# Patient Record
Sex: Male | Born: 1945 | Race: Black or African American | Hispanic: No | Marital: Married | Smoking: Former smoker
Health system: Southern US, Community
[De-identification: ages and names within clinical notes are randomized; demographics above are authoritative.]

## PROBLEM LIST (undated history)

## (undated) DIAGNOSIS — I10 Essential (primary) hypertension: Secondary | ICD-10-CM

## (undated) DIAGNOSIS — I2510 Atherosclerotic heart disease of native coronary artery without angina pectoris: Secondary | ICD-10-CM

## (undated) DIAGNOSIS — E785 Hyperlipidemia, unspecified: Secondary | ICD-10-CM

## (undated) HISTORY — PX: CARDIAC CATHETERIZATION: SHX172

## (undated) HISTORY — PX: ROTATOR CUFF REPAIR: SHX139

## (undated) HISTORY — PX: MOUTH SURGERY: SHX715

---

## 1999-08-27 ENCOUNTER — Ambulatory Visit (HOSPITAL_COMMUNITY): Admission: RE | Admit: 1999-08-27 | Discharge: 1999-08-28 | Payer: Self-pay | Admitting: *Deleted

## 1999-08-27 ENCOUNTER — Encounter: Payer: Self-pay | Admitting: *Deleted

## 1999-09-01 ENCOUNTER — Encounter: Admission: RE | Admit: 1999-09-01 | Discharge: 1999-11-30 | Payer: Self-pay | Admitting: Pulmonary Disease

## 2004-09-29 ENCOUNTER — Ambulatory Visit: Payer: Self-pay | Admitting: Cardiology

## 2004-10-29 ENCOUNTER — Ambulatory Visit: Payer: Self-pay

## 2004-11-23 ENCOUNTER — Ambulatory Visit (HOSPITAL_COMMUNITY): Admission: RE | Admit: 2004-11-23 | Discharge: 2004-11-23 | Payer: Self-pay | Admitting: Pulmonary Disease

## 2005-11-08 HISTORY — PX: CARDIAC CATHETERIZATION: SHX172

## 2005-11-24 ENCOUNTER — Ambulatory Visit: Payer: Self-pay | Admitting: Cardiology

## 2006-05-19 ENCOUNTER — Ambulatory Visit (HOSPITAL_COMMUNITY): Admission: RE | Admit: 2006-05-19 | Discharge: 2006-05-19 | Payer: Self-pay | Admitting: Pulmonary Disease

## 2007-01-27 ENCOUNTER — Ambulatory Visit: Payer: Self-pay | Admitting: Internal Medicine

## 2007-01-27 ENCOUNTER — Ambulatory Visit (HOSPITAL_COMMUNITY): Admission: RE | Admit: 2007-01-27 | Discharge: 2007-01-27 | Payer: Self-pay | Admitting: Internal Medicine

## 2008-04-02 ENCOUNTER — Ambulatory Visit: Payer: Self-pay | Admitting: Cardiology

## 2008-04-08 ENCOUNTER — Ambulatory Visit: Payer: Self-pay

## 2009-05-24 DIAGNOSIS — I2510 Atherosclerotic heart disease of native coronary artery without angina pectoris: Secondary | ICD-10-CM | POA: Insufficient documentation

## 2009-05-24 DIAGNOSIS — E119 Type 2 diabetes mellitus without complications: Secondary | ICD-10-CM | POA: Insufficient documentation

## 2009-05-24 DIAGNOSIS — R0609 Other forms of dyspnea: Secondary | ICD-10-CM

## 2009-05-24 DIAGNOSIS — R0989 Other specified symptoms and signs involving the circulatory and respiratory systems: Secondary | ICD-10-CM | POA: Insufficient documentation

## 2009-06-05 ENCOUNTER — Ambulatory Visit: Payer: Self-pay | Admitting: Cardiology

## 2011-02-12 ENCOUNTER — Other Ambulatory Visit: Payer: Self-pay | Admitting: Cardiology

## 2011-03-23 NOTE — Assessment & Plan Note (Signed)
[facility]                            CARDIOLOGY OFFICE NOTE   NAME:Timothy Brennan, Timothy Brennan                       MRN:          [id number]  DATE:04/02/2008                            DOB:          07-23-1946    Mr. Gunia returns today for further management of his coronary artery  disease.  It has been over 2 years since we saw him last.   He has had a previous stent to the LAD in October of 2000.  His last  stress Myoview was October 29, 2004, which showed EF 49% with some mild  superimposed ischemia and scarring of the apex.  We have treated him  medically.   He is having no angina.  He continues to work and has for 13 straight  years at [employer].  He commutes from [place] every day.   He is having his blood work followed by Dr. Juanetta Gosling.  He has type 2  diabetes and mixed hyperlipidemia.   He currently denies any orthopnea, PND or peripheral edema.  He has had  no palpitations, syncope or presyncope.  He has had no angina.   He does have some dyspnea on exertion.   CURRENT MEDS:  1. Accupril 20 mg a day.  2. Zocor 10 mg a day.  3. Aspirin 325 mg a day.  4. Foltx daily.  5. Norvasc 10 mg a day.  6. Fish oil 1000 mg a day.  7. Hydrochlorothiazide 12 mg per day.  8. Janumet 50/50 daily.   EXAM:  Blood pressure is 141/73, his pulse 56 and regular.  Weight is  196 down 8.  He looks much younger than stated age.  HEENT:  Sclerae are injected.  PERRL.  Extraocular is intact.  Facial  symmetry is normal.  Extraocular is intact.  Carotid upstrokes were equal bilaterally without bruits, no JVD.  Thyroid is not enlarged.  Trachea is midline.  LUNGS:  Clear to auscultation.  HEART:  Reveals a nondisplaced PMI.  Normal S1-S2.  No murmur, rub or  gallop.  ABDOMEN:  Soft, no midline bruit.  No hepatomegaly.  EXTREMITIES:  No cyanosis, clubbing or edema.  Pulses are 2+/4+  bilaterally symmetrical.  Skin is warm and dry.  Again he looks much younger  than stated age.  NEURO:  Exam is intact.  MUSCULOSKELETAL:  Shows some chronic arthritic changes.   EKG shows normal sinus rhythm with RSR prime V1-V2 which is new.   ASSESSMENT/PLAN:  Mr. Colquhoun is doing well.  He is due objective  assessment of his coronary disease.  With his diabetes, he could be  having some silent ischemia.  He does have some dyspnea on exertion.  He  also has an RSR prime in V1-V2.   Assuming this is negative for ischemia, we will see him back in a year.     Thomas C. Daleen Squibb, MD, [facility]  Electronically Signed    TCW/MedQ  DD: 04/02/2008  DT: 04/02/2008  Job #: [id number]   cc:   Ramon Dredge L. Juanetta Gosling, M.D.

## 2011-03-26 NOTE — Op Note (Signed)
NAME:  Brennan, Timothy                ACCOUNT NO.:  [id number]   MEDICAL RECORD NO.:  [id number]          PATIENT TYPE:  AMB   LOCATION:  DAY                           FACILITY:  [facility]   PHYSICIAN:  Lionel December, M.D.    DATE OF BIRTH:  July 17, 1946   DATE OF PROCEDURE:  01/27/2007  DATE OF DISCHARGE:                               OPERATIVE REPORT   PROCEDURE:  Colonoscopy.   INDICATIONS:  Hodges is a 65 year old African American male who is  undergoing average risk screening colonoscopy.  Procedure risks were  reviewed with the patient and informed consent was obtained.   MEDS FOR CONSCIOUS SEDATION:  Demerol 50 mg IV, Versed 4 mg IV.   FINDINGS:  Procedure performed in endoscopy suite.  The patient's vital  signs and O2 sat were monitored during procedure and remained stable.  The patient was placed left lateral position and rectal examination  performed.  No abnormality noted on external or digital exam.  The  Pentax videoscope was placed in the rectum and advanced under vision  into sigmoid colon beyond.  Preparation was satisfactory.  Scope was  passed into cecum which was identified by appendiceal orifice and  ileocecal valve.  Short segment of TI was also examined and was normal.  As the scope was withdrawn, colonic mucosa was carefully examined and  was normal throughout.  Rectal mucosa similarly was normal.  Scope was  retroflexed to examine anorectal junction which was unremarkable.  Endoscope was straightened and withdrawn.  The patient tolerated the  procedure well.   FINAL DIAGNOSIS:  Normal colonoscopy.   RECOMMENDATIONS:  He will resume his usual diet and medicines.   Yearly Hemoccults.   He may consider next screening exam in 10 years from now.      Lionel December, M.D.  Electronically Signed     NR/MEDQ  D:  01/27/2007  T:  01/27/2007  Job:  [id number]   cc:   Ramon Dredge L. Juanetta Gosling, M.D.  Fax: [phone]

## 2011-04-10 ENCOUNTER — Other Ambulatory Visit: Payer: Self-pay | Admitting: Cardiology

## 2011-10-19 NOTE — H&P (Signed)
NTS SOAP Note  Vital Signs:  Vitals as of: 10/12/2011: Systolic 175: Diastolic 80: Heart Rate 71: Temp 97.33F: Height 31ft 8in: Weight 195Lbs 0 Ounces: Pain Level 0: BMI 30  BMI : 29.65 kg/m2  Subjective: This 65 Years 63 Months old Male presents for of  HERNIA: ,Recently noted increasing swelling and discomfort in the left groin region.  Was told he had a left inguinal hernia.  Denies nausea, vomiting.  Made worse with straining.  Review of Symptoms:  Constitutional:unremarkable Head:unremarkable Eyes:unremarkable Nose/Mouth/Throat:unremarkable Cardiovascular:unremarkable Respiratory:unremarkable Gastrointestinal:unremarkable Genitourinary:unremarkable Musculoskeletal:unremarkable Skin:unremarkable Hematolgic/Lymphatic:unremarkable Allergic/Immunologic:unremarkable   Past Medical History:Reviewed   Past Medical History  Surgical History: left shoulder surgery Medical Problems:  Diabetes, High Blood pressure, High cholesterol Allergies: nkda Medications: janumet, simvastatin, hydrocholorothiazide, amlodipine, cialis, quinapril   Social History:Reviewed   Social History  Preferred Language: English ([place]) Race:  Black or African American Ethnicity: Not Hispanic / Latino Age: 65 Years 10 Months Marital Status:  M Alcohol: denies Recreational drug(s):  No   Smoking Status: Never smoker reviewed on 10/12/2011  Family History:Reviewed   Family History  Is there a family history WU:[id number]    Objective Information: General:Well appearing, well nourished in no distress. Skin:no rash or prominent lesions Head:Atraumatic; no masses; no abnormalities Neck:Supple without lymphadenopathy.  Heart:RRR, no murmur or gallop.  Normal S1, S2.  No S3, S4.  Lungs:CTA bilaterally, no wheezes, rhonchi, rales.  Breathing unlabored. Abdomen:Soft, NT/ND, normal bowel sounds, no HSM, no masses.  No  peritoneal signs.  Reducible left inguinal hernia. AO:[id number]  Assessment:Left inguinal hernia  Diagnosis &amp; Procedure: DiagnosisCode: 550.90, ProcedureCode: 29528,    Plan:Scheduled for left inguinal herniorrhaphy on 11/15/11.   Patient Education:Alternative treatments to surgery were discussed with patient (and family).Risks and benefits  of procedure were fully explained to the patient (and family) who gave informed consent. Patient/family questions were addressed.  Follow-up:Pending Surgery

## 2011-11-05 ENCOUNTER — Encounter (HOSPITAL_COMMUNITY): Payer: Self-pay | Admitting: Pharmacy Technician

## 2011-11-11 ENCOUNTER — Other Ambulatory Visit: Payer: Self-pay

## 2011-11-11 ENCOUNTER — Encounter (HOSPITAL_COMMUNITY): Payer: Self-pay

## 2011-11-11 ENCOUNTER — Encounter (HOSPITAL_COMMUNITY)
Admission: RE | Admit: 2011-11-11 | Discharge: 2011-11-11 | Disposition: A | Discharge status: Home / Self Care | Payer: 59 | Source: Ambulatory Visit | Attending: General Surgery | Admitting: General Surgery

## 2011-11-11 HISTORY — DX: Essential (primary) hypertension: I10

## 2011-11-11 HISTORY — DX: Hyperlipidemia, unspecified: E78.5

## 2011-11-11 LAB — CBC
HCT: 42 % (ref 39.0–52.0)
Hemoglobin: 14.1 g/dL (ref 13.0–17.0)
MCHC: 33.6 g/dL (ref 30.0–36.0)
RBC: 5.05 MIL/uL (ref 4.22–5.81)
WBC: 5.8 10*3/uL (ref 4.0–10.5)

## 2011-11-11 LAB — BASIC METABOLIC PANEL
Chloride: 104 mEq/L (ref 96–112)
GFR calc Af Amer: 89 mL/min — ABNORMAL LOW (ref >90)
GFR calc non Af Amer: 77 mL/min — ABNORMAL LOW (ref >90)
Potassium: 3.8 mEq/L (ref 3.5–5.1)
Sodium: 140 mEq/L (ref 135–145)

## 2011-11-11 LAB — DIFFERENTIAL
Basophils Relative: 0 % (ref 0–1)
Lymphocytes Relative: 23 % (ref 12–46)
Lymphs Abs: 1.3 10*3/uL (ref 0.7–4.0)
Monocytes Absolute: 0.5 10*3/uL (ref 0.1–1.0)
Monocytes Relative: 8 % (ref 3–12)
Neutro Abs: 3.9 10*3/uL (ref 1.7–7.7)
Neutrophils Relative %: 67 % (ref 43–77)

## 2011-11-11 LAB — SURGICAL PCR SCREEN: MRSA, PCR: NEGATIVE

## 2011-11-11 NOTE — Patient Instructions (Addendum)
22 Timothy Brennan  11/11/2011   Your procedure is scheduled on:  11/16/11  Report to [facility] at 06:15 AM.  Call this number if you have problems the morning of surgery: [phone]   Remember:   Do not eat food:After Midnight.  May have clear liquids:until Midnight .  Clear liquids include soda, tea, black coffee, apple or grape juice, broth.  Take these medicines the morning of surgery with A SIP OF WATER: Amlodipine, Quinapril, and Hydrochlorothiazide.   Do not wear jewelry, make-up or nail polish.  Do not wear lotions, powders, or perfumes. You may wear deodorant.  Do not shave 48 hours prior to surgery.  Do not bring valuables to the hospital.  Contacts, dentures or bridgework may not be worn into surgery.  Leave suitcase in the car. After surgery it may be brought to your room.  For patients admitted to the hospital, checkout time is 11:00 AM the day of discharge.   Patients discharged the day of surgery will not be allowed to drive home.  Name and phone number of your driver:   Special Instructions: CHG Shower Use Special Wash: 1/2 bottle night before surgery and 1/2 bottle morning of surgery.   Please read over the following fact sheets that you were given: Pain Booklet, Surgical Site Infection Prevention, Anesthesia Post-op Instructions and Care and Recovery After Surgery    Hernia, Surgical Repair A hernia occurs when an internal organ pushes out through a weak spot in the belly (abdominal) wall muscles. Hernias commonly occur in the groin and around the navel. Hernias often can be pushed back into place (reduced). Most hernias tend to get worse over time. Problems occur when abdominal contents get stuck in the opening (incarcerated hernia). The blood supply gets cut off (strangulated hernia). This is an emergency and needs surgery. Otherwise, hernia repair can be an elective procedure. This means you can schedule this at your convenience when an emergency is not present. Because  complications can occur, if you decide to repair the hernia, it is best to do it soon. When it becomes an emergency procedure, there is increased risk of complications after surgery. CAUSES   Heavy lifting.   Obesity.   Prolonged coughing.   Straining to move your bowels.   Hernias can also occur through a cut (incision) by a surgeonafter an abdominal operation.  HOME CARE INSTRUCTIONS Before the repair:  Bed rest is not required. You may continue your normal activities, but avoid heavy lifting (more than 10 pounds) or straining. Cough gently. If you are a smoker, it is best to stop. Even the best hernia repair can break down with the continual strain of coughing.   Do not wear anything tight over your hernia. Do not try to keep it in with an outside bandage or truss. These can damage abdominal contents if they are trapped in the hernia sac.   Eat a normal diet. Avoid constipation. Straining over long periods of time to have a bowel movement will increase hernia size. It also can breakdown repairs. If you cannot do this with diet alone, laxatives or stool softeners may be used.  PRIOR TO SURGERY, SEEK IMMEDIATE MEDICAL CARE IF: You have problems (symptoms) of a trapped (incarcerated) hernia. Symptoms include:  An oral temperature above 102 F (38.9 C) develops, or as your caregiver suggests.   Increasing abdominal pain.   Feeling sick to your stomach(nausea) and vomiting.   You stop passing gas or stool.   The hernia  is stuck outside the abdomen, looks discolored, feels hard, or is tender.   You have any changes in your bowel habits or in the hernia that is unusual for you.  LET YOUR CAREGIVERS KNOW ABOUT THE FOLLOWING:  Allergies.   Medications taken including herbs, eye drops, over the counter medications, and creams.   Use of steroids (by mouth or creams).   Family or personal history of problems with anesthetics or Novocaine.   Possibility of pregnancy, if this  applies.   Personal history of blood clots (thrombophlebitis).   Family or personal history of bleeding or blood problems.   Previous surgery.   Other health problems.  BEFORE THE PROCEDURE You should be present 1 hour prior to your procedure, or as directed by your caregiver.  AFTER THE PROCEDURE After surgery, you will be taken to the recovery area. A nurse will watch and check your progress there. Once you are awake, stable, and taking fluids well, you will be allowed to go home as long as there are no problems. Once home, an ice pack (wrapped in a light towel) applied to your operative site may help with discomfort. It may also keep the swelling down. Do not lift anything heavier than 10 pounds (4.55 kilograms). Take showers not baths. Do not drive while taking narcotics. Follow instructions as suggested by your caregiver.  SEEK IMMEDIATE MEDICAL CARE IF: After surgery:  There is redness, swelling, or increasing pain in the wound.   There is pus coming from the wound.   There is drainage from a wound lasting longer than 1 day.   An unexplained oral temperature above 102 F (38.9 C) develops.   You notice a foul smell coming from the wound or dressing.   There is a breaking open of a wound (edged not staying together) after the sutures have been removed.   You notice increasing pain in the shoulders (shoulder strap areas).   You develop dizzy episodes or fainting while standing.   You develop persistent nausea or vomiting.   You develop a rash.   You have difficulty breathing.   You develop any reaction or side effects to medications given.  MAKE SURE YOU:   Understand these instructions.   Will watch your condition.   Will get help right away if you are not doing well or get worse.  Document Released: 04/20/2001 Document Revised: 07/07/2011 Document Reviewed: 03/12/2008 [facility] Patient Information 2012 [place], [place].   PATIENT  INSTRUCTIONS POST-ANESTHESIA  IMMEDIATELY FOLLOWING SURGERY:  Do not drive or operate machinery for the first twenty four hours after surgery.  Do not make any important decisions for twenty four hours after surgery or while taking narcotic pain medications or sedatives.  If you develop intractable nausea and vomiting or a severe headache please notify your doctor immediately.  FOLLOW-UP:  Please make an appointment with your surgeon as instructed. You do not need to follow up with anesthesia unless specifically instructed to do so.  WOUND CARE INSTRUCTIONS (if applicable):  Keep a dry clean dressing on the anesthesia/puncture wound site if there is drainage.  Once the wound has quit draining you may leave it open to air.  Generally you should leave the bandage intact for twenty four hours unless there is drainage.  If the epidural site drains for more than 36-48 hours please call the anesthesia department.  QUESTIONS?:  Please feel free to call your physician or the hospital operator if you have any questions, and they will be happy  to assist you.     [facility] Anesthesia Department [address] [place] [phone]

## 2011-11-15 ENCOUNTER — Encounter (HOSPITAL_COMMUNITY)
Admission: RE | Disposition: A | Discharge status: Home / Self Care | Payer: Self-pay | Source: Ambulatory Visit | Attending: General Surgery

## 2011-11-15 ENCOUNTER — Ambulatory Visit (HOSPITAL_COMMUNITY)
Admission: RE | Admit: 2011-11-15 | Discharge: 2011-11-15 | Disposition: A | Discharge status: Home / Self Care | Payer: 59 | Source: Ambulatory Visit | Attending: General Surgery | Admitting: General Surgery

## 2011-11-15 ENCOUNTER — Encounter (HOSPITAL_COMMUNITY): Payer: Self-pay | Admitting: *Deleted

## 2011-11-15 ENCOUNTER — Ambulatory Visit (HOSPITAL_COMMUNITY): Payer: 59 | Admitting: Anesthesiology

## 2011-11-15 ENCOUNTER — Encounter (HOSPITAL_COMMUNITY): Payer: Self-pay | Admitting: Anesthesiology

## 2011-11-15 DIAGNOSIS — I10 Essential (primary) hypertension: Secondary | ICD-10-CM | POA: Insufficient documentation

## 2011-11-15 DIAGNOSIS — E119 Type 2 diabetes mellitus without complications: Secondary | ICD-10-CM | POA: Insufficient documentation

## 2011-11-15 DIAGNOSIS — Z01812 Encounter for preprocedural laboratory examination: Secondary | ICD-10-CM | POA: Insufficient documentation

## 2011-11-15 DIAGNOSIS — K4090 Unilateral inguinal hernia, without obstruction or gangrene, not specified as recurrent: Secondary | ICD-10-CM | POA: Insufficient documentation

## 2011-11-15 DIAGNOSIS — Z79899 Other long term (current) drug therapy: Secondary | ICD-10-CM | POA: Insufficient documentation

## 2011-11-15 DIAGNOSIS — Z01810 Encounter for preprocedural cardiovascular examination: Secondary | ICD-10-CM | POA: Insufficient documentation

## 2011-11-15 HISTORY — PX: INGUINAL HERNIA REPAIR: SHX194

## 2011-11-15 LAB — GLUCOSE, CAPILLARY
Glucose-Capillary: 119 mg/dL — ABNORMAL HIGH (ref 70–99)
Glucose-Capillary: 88 mg/dL (ref 70–99)

## 2011-11-15 SURGERY — REPAIR, HERNIA, INGUINAL, ADULT
Anesthesia: Spinal | Site: Groin | Laterality: Left | Wound class: Clean

## 2011-11-15 MED ORDER — SODIUM CHLORIDE 0.9 % IR SOLN
Status: DC | PRN
  Administered 2011-11-15: 1000 mL

## 2011-11-15 MED ORDER — BUPIVACAINE IN DEXTROSE 0.75-8.25 % IT SOLN
INTRATHECAL | Status: DC | PRN
  Administered 2011-11-15: 11.25 mg via INTRATHECAL

## 2011-11-15 MED ORDER — HYDROCODONE-ACETAMINOPHEN 5-325 MG PO TABS: 1.0000 | ORAL_TABLET | ORAL | Status: AC | PRN

## 2011-11-15 MED ORDER — BUPIVACAINE IN DEXTROSE 0.75-8.25 % IT SOLN
INTRATHECAL | Status: AC
  Filled 2011-11-15: qty 2

## 2011-11-15 MED ORDER — PROPOFOL 10 MG/ML IV EMUL
INTRAVENOUS | Status: AC
  Filled 2011-11-15: qty 20

## 2011-11-15 MED ORDER — CEFAZOLIN SODIUM-DEXTROSE 2-3 GM-% IV SOLR
2.0000 g | INTRAVENOUS | Status: AC
  Administered 2011-11-15: 2 g via INTRAVENOUS

## 2011-11-15 MED ORDER — CEFAZOLIN SODIUM-DEXTROSE 2-3 GM-% IV SOLR
INTRAVENOUS | Status: AC
  Filled 2011-11-15: qty 50

## 2011-11-15 MED ORDER — BUPIVACAINE HCL (PF) 0.5 % IJ SOLN
INTRAMUSCULAR | Status: DC | PRN
  Administered 2011-11-15: 10 mL

## 2011-11-15 MED ORDER — MIDAZOLAM HCL 5 MG/5ML IJ SOLN
INTRAMUSCULAR | Status: DC | PRN
  Administered 2011-11-15 (×2): 1 mg via INTRAVENOUS

## 2011-11-15 MED ORDER — KETOROLAC TROMETHAMINE 30 MG/ML IJ SOLN
INTRAMUSCULAR | Status: AC
  Administered 2011-11-15: 30 mg via INTRAVENOUS
  Filled 2011-11-15: qty 1

## 2011-11-15 MED ORDER — ENOXAPARIN SODIUM 40 MG/0.4ML ~~LOC~~ SOLN
SUBCUTANEOUS | Status: AC
  Administered 2011-11-15: 40 mg via SUBCUTANEOUS
  Filled 2011-11-15: qty 0.4

## 2011-11-15 MED ORDER — FENTANYL CITRATE 0.05 MG/ML IJ SOLN
INTRAMUSCULAR | Status: DC | PRN
  Administered 2011-11-15: 25 ug via INTRAVENOUS
  Administered 2011-11-15: 12.5 ug via INTRAVENOUS
  Administered 2011-11-15: 25 ug via INTRAVENOUS

## 2011-11-15 MED ORDER — MIDAZOLAM HCL 2 MG/2ML IJ SOLN
1.0000 mg | INTRAMUSCULAR | Status: DC | PRN
  Administered 2011-11-15: 2 mg via INTRAVENOUS

## 2011-11-15 MED ORDER — LIDOCAINE HCL (PF) 1 % IJ SOLN
INTRAMUSCULAR | Status: AC
  Filled 2011-11-15: qty 5

## 2011-11-15 MED ORDER — MIDAZOLAM HCL 2 MG/2ML IJ SOLN
INTRAMUSCULAR | Status: AC
  Filled 2011-11-15: qty 2

## 2011-11-15 MED ORDER — FENTANYL CITRATE 0.05 MG/ML IJ SOLN
INTRAMUSCULAR | Status: DC | PRN
  Administered 2011-11-15: 12.5 ug via INTRATHECAL

## 2011-11-15 MED ORDER — PROPOFOL 10 MG/ML IV EMUL
INTRAVENOUS | Status: DC | PRN
  Administered 2011-11-15: 100 ug/kg/min via INTRAVENOUS

## 2011-11-15 MED ORDER — KETOROLAC TROMETHAMINE 30 MG/ML IJ SOLN
30.0000 mg | Freq: Once | INTRAMUSCULAR | Status: AC
  Administered 2011-11-15: 30 mg via INTRAVENOUS

## 2011-11-15 MED ORDER — LACTATED RINGERS IV SOLN
INTRAVENOUS | Status: DC
  Administered 2011-11-15: 1000 mL via INTRAVENOUS

## 2011-11-15 MED ORDER — ONDANSETRON HCL 4 MG/2ML IJ SOLN: 4.0000 mg | Freq: Once | INTRAMUSCULAR | Status: DC | PRN

## 2011-11-15 MED ORDER — LIDOCAINE HCL (CARDIAC) 10 MG/ML IV SOLN
INTRAVENOUS | Status: DC | PRN
  Administered 2011-11-15: 10 mg via INTRAVENOUS

## 2011-11-15 MED ORDER — FENTANYL CITRATE 0.05 MG/ML IJ SOLN
INTRAMUSCULAR | Status: AC
  Filled 2011-11-15: qty 2

## 2011-11-15 MED ORDER — FENTANYL CITRATE 0.05 MG/ML IJ SOLN: 25.0000 ug | INTRAMUSCULAR | Status: DC | PRN

## 2011-11-15 MED ORDER — ENOXAPARIN SODIUM 40 MG/0.4ML ~~LOC~~ SOLN
40.0000 mg | Freq: Once | SUBCUTANEOUS | Status: AC
  Administered 2011-11-15: 40 mg via SUBCUTANEOUS

## 2011-11-15 SURGICAL SUPPLY — 40 items
BAG HAMPER (MISCELLANEOUS) ×2 IMPLANT
CLOTH BEACON ORANGE TIMEOUT ST (SAFETY) ×2 IMPLANT
COVER LIGHT HANDLE STERIS (MISCELLANEOUS) ×4 IMPLANT
DECANTER SPIKE VIAL GLASS SM (MISCELLANEOUS) ×2 IMPLANT
DERMABOND ADVANCED (GAUZE/BANDAGES/DRESSINGS) ×1
DERMABOND ADVANCED .7 DNX12 (GAUZE/BANDAGES/DRESSINGS) ×1 IMPLANT
DRAIN PENROSE 18X.75 LTX STRL (MISCELLANEOUS) ×2 IMPLANT
ELECT REM PT RETURN 9FT ADLT (ELECTROSURGICAL) ×2
ELECTRODE REM PT RTRN 9FT ADLT (ELECTROSURGICAL) ×1 IMPLANT
FORMALIN 10 PREFIL 120ML (MISCELLANEOUS) IMPLANT
GLOVE BIO SURGEON STRL SZ7.5 (GLOVE) ×2 IMPLANT
GLOVE ECLIPSE 6.5 STRL STRAW (GLOVE) ×4 IMPLANT
GLOVE EXAM NITRILE MD LF STRL (GLOVE) ×2 IMPLANT
GLOVE INDICATOR 7.0 STRL GRN (GLOVE) ×4 IMPLANT
GOWN STRL REIN XL XLG (GOWN DISPOSABLE) ×6 IMPLANT
INST SET MINOR GENERAL (KITS) ×2 IMPLANT
KIT ROOM TURNOVER APOR (KITS) ×2 IMPLANT
MANIFOLD NEPTUNE II (INSTRUMENTS) ×2 IMPLANT
MESH HERNIA 1.6X1.9 PLUG LRG (Mesh General) ×1 IMPLANT
MESH HERNIA PLUG LRG (Mesh General) ×1 IMPLANT
MESH MARLEX PLUG MEDIUM (Mesh General) ×2 IMPLANT
NEEDLE HYPO 25X1 1.5 SAFETY (NEEDLE) ×2 IMPLANT
NS IRRIG 1000ML POUR BTL (IV SOLUTION) ×2 IMPLANT
PACK MINOR (CUSTOM PROCEDURE TRAY) ×2 IMPLANT
PAD ARMBOARD 7.5X6 YLW CONV (MISCELLANEOUS) ×2 IMPLANT
PAIN PUMP ON-Q 100MLX2ML 2.5IN (PAIN MANAGEMENT) IMPLANT
SET BASIN LINEN APH (SET/KITS/TRAYS/PACK) ×2 IMPLANT
SOL PREP PROV IODINE SCRUB 4OZ (MISCELLANEOUS) ×2 IMPLANT
SUT NOVA NAB GS-22 2 2-0 T-19 (SUTURE) ×6 IMPLANT
SUT NOVAFIL NAB HGS22 2-0 30IN (SUTURE) IMPLANT
SUT SILK 3 0 (SUTURE)
SUT SILK 3-0 18XBRD TIE 12 (SUTURE) IMPLANT
SUT VIC AB 2-0 CT1 27 (SUTURE) ×1
SUT VIC AB 2-0 CT1 TAPERPNT 27 (SUTURE) ×1 IMPLANT
SUT VIC AB 3-0 SH 27 (SUTURE) ×1
SUT VIC AB 3-0 SH 27X BRD (SUTURE) ×1 IMPLANT
SUT VIC AB 4-0 PS2 27 (SUTURE) ×2 IMPLANT
SUT VICRYL AB 3 0 TIES (SUTURE) IMPLANT
SYR CONTROL 10ML LL (SYRINGE) ×2 IMPLANT
TOWEL OR 17X26 4PK STRL BLUE (TOWEL DISPOSABLE) IMPLANT

## 2011-11-15 NOTE — Op Note (Signed)
Patient:  Timothy Brennan  DOB:  31-Dec-1945  MRN:  [id number]   Preop Diagnosis:  Left inguinal hernia  Postop Diagnosis:  Same, direct and indirect  Procedure:  Left inguinal herniorrhaphy  Surgeon:  Franky Macho, M.D.  Anes:  Spinal  Indications:  Patient is a 66 year old black male who presents with a symptomatic left inguinal hernia. The risks and benefits of the procedure including bleeding, infection, and a possibly recurrence of the hernia were fully explained to the patient, gave informed consent.  Procedure note:  Patient is placed the supine position. After spinal anesthesia was noted, left groin region was prepped and draped using the usual sterile technique with DuraPrep. Surgical site confirmation was performed.  A transverse incision was made in the left groin region down to the external oblique aponeurosis.The aponeurosis was incised to the external ring. A Penrose drain was placed around the spermatic cord. The vas deferens was noted in the spermatic cord. The ilioinguinal nerve was identified retracted superiorly from the operative field. An indirect hernia sac was found. This was freed away from the spermatic cord up to the peritoneal reflection and inverted. A medium size Bard mesh PerFix plug was then inserted and secured to the transversalis fascia using a 2-0 Novafil interrupted suture. A direct hernia was also found. This was incised at its base and inverted. A large size Bard mesh PerFix plug was then placed and secured circumferentially to the transversalis fascia using 2-0 Novafil interrupted sutures. An onlay patch was then placed along the floor of inguinal canal and secured superiorly to the conjoined tendon and inferiorly to the shelving edge of Poupart's ligament using 2-0 Novafil interrupted sutures. The internal ring was recreated using a 2-0 Novafil interrupted suture. The external oblique a Penrose this was reapproximated using 2-0 Vicryl running suture. The  subcutaneous tissue was reapproximated using 3-0 Vicryl interrupted sutures. The skin was closed using a 4-0 Vicryl subcuticular suture. 0.5% Sensorcaine was instilled into the surrounding wound. Dermabond was then applied.  All tape and needle counts were correct at the end of the procedure. Patient was transferred to PACU in stable condition.  Complications:   none  EBL: Minimal  Specimen:  none

## 2011-11-15 NOTE — Anesthesia Postprocedure Evaluation (Signed)
Anesthesia Post Note  Patient: Timothy Brennan  Procedure(s) Performed:  HERNIA REPAIR INGUINAL ADULT  Anesthesia type: Spinal  Patient location: PACU  Post pain: Pain level controlled  Post assessment: Post-op Vital signs reviewed, Patient's Cardiovascular Status Stable, Respiratory Function Stable, Patent Airway, No signs of Nausea or vomiting and Pain level controlled  Last Vitals:  Filed Vitals:   11/15/11 0854  BP: 104/60  Pulse: 59  Temp: 36.4 C  Resp: 12    Post vital signs: Reviewed and stable  Level of consciousness: awake and alert   Complications: No apparent anesthesia complications  Blood sugar 88

## 2011-11-15 NOTE — Progress Notes (Signed)
Awake. Denies pain. Refuses po fluids. Wife notified of pt condition. Voiced understanding.

## 2011-11-15 NOTE — Transfer of Care (Signed)
Immediate Anesthesia Transfer of Care Note  Patient: Timothy Brennan  Procedure(s) Performed:  HERNIA REPAIR INGUINAL ADULT  Patient Location: PACU  Anesthesia Type: SAB  Level of Consciousness: awake  Airway & Oxygen Therapy: Patient Spontanous Breathing Nasal cannula  Post-op Assessment: Report given to PACU RN, Post -op Vital signs reviewed and stable. SAB Level  T 10  Post vital signs: Reviewed and stable  Complications: No apparent anesthesia complications

## 2011-11-15 NOTE — Progress Notes (Signed)
Beginning to move lower extremities. Able to slowly bend rt knee. Unable to bend lt knee. Unable to raise bottom off bed. Refuses po fluids. Denies pain.

## 2011-11-15 NOTE — Anesthesia Procedure Notes (Addendum)
Date/Time: 11/15/2011 7:45 AM Performed by: Minerva Areola Pre-anesthesia Checklist: Patient identified, Patient being monitored, Emergency Drugs available, Timeout performed and Suction available Patient Re-evaluated:Patient Re-evaluated prior to inductionOxygen Delivery Method: Nasal Cannula    Spinal  Patient location during procedure: OR Start time: 11/15/2011 7:51 AM End time: 11/15/2011 7:58 AM Staffing CRNA/Resident: Minerva Areola Preanesthetic Checklist Completed: patient identified, site marked, surgical consent, pre-op evaluation, timeout performed, IV checked, risks and benefits discussed and monitors and equipment checked Spinal Block Patient position: left lateral decubitus Prep: Betadine and prep x 3 Patient monitoring: heart rate, cardiac monitor, continuous pulse ox and blood pressure Approach: left paramedian Location: L4-5 Injection technique: single-shot Needle Needle type: Spinocan  Needle gauge: 22 G Needle length: 9 cm Assessment Sensory level: T6 (level at 0804) Events: clear csf pre and post injection Additional Notes Skin localization 1% Lidocaine Marcaine 11.25 mg/ Fentanyl 12.5 mcg Tray Lot [id number] Tray expiration: 2013-11  Date/Time: 11/15/2011 8:11 AM Performed by: Minerva Areola Oxygen Delivery Method: Simple face mask

## 2011-11-15 NOTE — Interval H&P Note (Signed)
History and Physical Interval Note:  11/15/2011 7:23 AM  Timothy Brennan  has presented today for surgery, with the diagnosis of Inguinal hernia without mention of obstruction or gangrene, unilateral or unspecified, (not specified as recurrent) [550.90]  The various methods of treatment have been discussed with the patient and family. After consideration of risks, benefits and other options for treatment, the patient has consented to  Procedure(s): HERNIA REPAIR INGUINAL ADULT as a surgical intervention .  The patients' history has been reviewed, patient examined, no change in status, stable for surgery.  I have reviewed the patients' chart and labs.  Questions were answered to the patient's satisfaction.     [name]

## 2011-11-15 NOTE — Anesthesia Preprocedure Evaluation (Signed)
Anesthesia Evaluation  Patient identified by MRN, date of birth, ID band Patient awake    Reviewed: Allergy & Precautions, H&P , NPO status , Patient's Chart, lab work & pertinent test results  History of Anesthesia Complications Negative for: history of anesthetic complications  Airway Mallampati: II      Dental  (+) Edentulous Upper   Pulmonary shortness of breath,  clear to auscultation        Cardiovascular hypertension, Pt. on medications + CAD and + Cardiac Stents Regular Normal    Neuro/Psych    GI/Hepatic   Endo/Other  Diabetes mellitus-, Well Controlled, Type 2, Oral Hypoglycemic Agents  Renal/GU      Musculoskeletal   Abdominal   Peds  Hematology   Anesthesia Other Findings   Reproductive/Obstetrics                           Anesthesia Physical Anesthesia Plan  ASA: III  Anesthesia Plan: Spinal   Post-op Pain Management:    Induction: Intravenous  Airway Management Planned: Nasal Cannula  Additional Equipment:   Intra-op Plan:   Post-operative Plan:   Informed Consent: I have reviewed the patients History and Physical, chart, labs and discussed the procedure including the risks, benefits and alternatives for the proposed anesthesia with the patient or authorized representative who has indicated his/her understanding and acceptance.     Plan Discussed with:   Anesthesia Plan Comments:         Anesthesia Quick Evaluation

## 2011-11-17 ENCOUNTER — Encounter (HOSPITAL_COMMUNITY): Payer: Self-pay | Admitting: General Surgery

## 2012-06-16 ENCOUNTER — Encounter (INDEPENDENT_AMBULATORY_CARE_PROVIDER_SITE_OTHER): Payer: 59 | Admitting: Ophthalmology

## 2012-06-16 DIAGNOSIS — I10 Essential (primary) hypertension: Secondary | ICD-10-CM

## 2012-06-16 DIAGNOSIS — H35039 Hypertensive retinopathy, unspecified eye: Secondary | ICD-10-CM

## 2012-06-16 DIAGNOSIS — H43819 Vitreous degeneration, unspecified eye: Secondary | ICD-10-CM

## 2012-06-16 DIAGNOSIS — H3530 Unspecified macular degeneration: Secondary | ICD-10-CM

## 2012-06-16 DIAGNOSIS — E1139 Type 2 diabetes mellitus with other diabetic ophthalmic complication: Secondary | ICD-10-CM

## 2012-06-16 DIAGNOSIS — E11319 Type 2 diabetes mellitus with unspecified diabetic retinopathy without macular edema: Secondary | ICD-10-CM

## 2012-06-16 DIAGNOSIS — H2510 Age-related nuclear cataract, unspecified eye: Secondary | ICD-10-CM

## 2013-06-19 ENCOUNTER — Ambulatory Visit (INDEPENDENT_AMBULATORY_CARE_PROVIDER_SITE_OTHER): Payer: Self-pay | Admitting: Ophthalmology

## 2013-12-14 ENCOUNTER — Emergency Department (HOSPITAL_COMMUNITY)
Admission: EM | Admit: 2013-12-14 | Discharge: 2013-12-14 | Disposition: A | Discharge status: Home / Self Care | Payer: Medicare HMO | Attending: Emergency Medicine | Admitting: Emergency Medicine

## 2013-12-14 ENCOUNTER — Emergency Department (HOSPITAL_COMMUNITY): Payer: Medicare HMO

## 2013-12-14 ENCOUNTER — Encounter (HOSPITAL_COMMUNITY): Payer: Self-pay | Admitting: Emergency Medicine

## 2013-12-14 DIAGNOSIS — R05 Cough: Secondary | ICD-10-CM | POA: Insufficient documentation

## 2013-12-14 DIAGNOSIS — Z87891 Personal history of nicotine dependence: Secondary | ICD-10-CM | POA: Insufficient documentation

## 2013-12-14 DIAGNOSIS — Z7982 Long term (current) use of aspirin: Secondary | ICD-10-CM | POA: Insufficient documentation

## 2013-12-14 DIAGNOSIS — I10 Essential (primary) hypertension: Secondary | ICD-10-CM | POA: Insufficient documentation

## 2013-12-14 DIAGNOSIS — R630 Anorexia: Secondary | ICD-10-CM | POA: Insufficient documentation

## 2013-12-14 DIAGNOSIS — E860 Dehydration: Secondary | ICD-10-CM

## 2013-12-14 DIAGNOSIS — Z79899 Other long term (current) drug therapy: Secondary | ICD-10-CM | POA: Insufficient documentation

## 2013-12-14 DIAGNOSIS — E785 Hyperlipidemia, unspecified: Secondary | ICD-10-CM | POA: Insufficient documentation

## 2013-12-14 DIAGNOSIS — E119 Type 2 diabetes mellitus without complications: Secondary | ICD-10-CM | POA: Insufficient documentation

## 2013-12-14 DIAGNOSIS — R059 Cough, unspecified: Secondary | ICD-10-CM | POA: Insufficient documentation

## 2013-12-14 DIAGNOSIS — Z9889 Other specified postprocedural states: Secondary | ICD-10-CM | POA: Insufficient documentation

## 2013-12-14 DIAGNOSIS — Z880 Allergy status to penicillin: Secondary | ICD-10-CM | POA: Insufficient documentation

## 2013-12-14 LAB — CBC WITH DIFFERENTIAL/PLATELET
BASOS PCT: 0 % (ref 0–1)
Basophils Absolute: 0 10*3/uL (ref 0.0–0.1)
EOS ABS: 0 10*3/uL (ref 0.0–0.7)
Eosinophils Relative: 0 % (ref 0–5)
HCT: 39.3 % (ref 39.0–52.0)
HEMOGLOBIN: 13.7 g/dL (ref 13.0–17.0)
Lymphocytes Relative: 9 % — ABNORMAL LOW (ref 12–46)
Lymphs Abs: 0.6 10*3/uL — ABNORMAL LOW (ref 0.7–4.0)
MCH: 29.1 pg (ref 26.0–34.0)
MCHC: 34.9 g/dL (ref 30.0–36.0)
MCV: 83.4 fL (ref 78.0–100.0)
MONO ABS: 0.8 10*3/uL (ref 0.1–1.0)
MONOS PCT: 12 % (ref 3–12)
NEUTROS PCT: 79 % — AB (ref 43–77)
Neutro Abs: 5.3 10*3/uL (ref 1.7–7.7)
Platelets: 140 10*3/uL — ABNORMAL LOW (ref 150–400)
RBC: 4.71 MIL/uL (ref 4.22–5.81)
RDW: 14.4 % (ref 11.5–15.5)
WBC: 6.7 10*3/uL (ref 4.0–10.5)

## 2013-12-14 LAB — URINALYSIS, ROUTINE W REFLEX MICROSCOPIC
BILIRUBIN URINE: NEGATIVE
Glucose, UA: NEGATIVE mg/dL
HGB URINE DIPSTICK: NEGATIVE
Ketones, ur: NEGATIVE mg/dL
Leukocytes, UA: NEGATIVE
Nitrite: NEGATIVE
PROTEIN: NEGATIVE mg/dL
Specific Gravity, Urine: 1.02 (ref 1.005–1.030)
UROBILINOGEN UA: 0.2 mg/dL (ref 0.0–1.0)
pH: 5.5 (ref 5.0–8.0)

## 2013-12-14 LAB — BASIC METABOLIC PANEL
BUN: 15 mg/dL (ref 6–23)
CO2: 22 mEq/L (ref 19–32)
CREATININE: 1.21 mg/dL (ref 0.50–1.35)
Calcium: 9 mg/dL (ref 8.4–10.5)
Chloride: 101 mEq/L (ref 96–112)
GFR, EST AFRICAN AMERICAN: 70 mL/min — AB (ref >90)
GFR, EST NON AFRICAN AMERICAN: 61 mL/min — AB (ref >90)
Glucose, Bld: 129 mg/dL — ABNORMAL HIGH (ref 70–99)
POTASSIUM: 4 meq/L (ref 3.7–5.3)
Sodium: 139 mEq/L (ref 137–147)

## 2013-12-14 LAB — TROPONIN I: Troponin I: <0.3 ng/mL (ref <0.30)

## 2013-12-14 MED ORDER — SODIUM CHLORIDE 0.9 % IV SOLN
1000.0000 mL | Freq: Once | INTRAVENOUS | Status: AC
  Administered 2013-12-14: 1000 mL via INTRAVENOUS

## 2013-12-14 MED ORDER — SODIUM CHLORIDE 0.9 % IV SOLN
1000.0000 mL | INTRAVENOUS | Status: DC
  Administered 2013-12-14: 1000 mL via INTRAVENOUS

## 2013-12-14 NOTE — ED Provider Notes (Signed)
CSN: [id number]     Arrival date & time 12/14/13  0149 History   First MD Initiated Contact with Patient 12/14/13 0157     Chief Complaint  Patient presents with  . Dehydration  . Cough    HPI Patient reports generalized malaise over the past several days.  He said productive cough.  He denies chills and fever.  He denies urinary complaints.  His wife noted that this evening he got up to get out of bed to go the bathroom and she heard him collapse off the toilet.  The patient at times daily he is feeling generally weak and when she attempted to stand him up again he became lightheaded and nearly fell to the ground again.  Reports mild headache at this time.  Denies shortness of breath.  Does report productive cough.  Denies unilateral weakness.  Family reports no change in speech.  Patient states since arriving in the emergency department here and he feels better.  Decreased oral intake secondary to anorexia.  Patient denies nausea vomiting.  No diarrhea.  No melena or hematochezia.   Past Medical History  Diagnosis Date  . Hypertension   . Hyperlipidemia   . Diabetes mellitus    Past Surgical History  Procedure Laterality Date  . Rotator cuff repair      left  . Cardiac catheterization      with stent placement  . Mouth surgery    . Inguinal hernia repair  11/15/2011    Procedure: HERNIA REPAIR INGUINAL ADULT;  Surgeon: Jamesetta So;  Location: [facility];  Service: General;  Laterality: Left;   Family History  Problem Relation Age of Onset  . Anesthesia problems Neg Hx   . Hypotension Neg Hx   . Malignant hyperthermia Neg Hx   . Pseudochol deficiency Neg Hx    History  Substance Use Topics  . Smoking status: Former Research scientist (life sciences)  . Smokeless tobacco: Not on file  . Alcohol Use: No    Review of Systems  All other systems reviewed and are negative.    Allergies  Penicillins  Home Medications   Current Outpatient Rx  Name  Route  Sig  Dispense  Refill  . amLODipine (NORVASC)  10 MG tablet   Oral   Take 10 mg by mouth daily.           Marland Kitchen aspirin EC 325 MG tablet   Oral   Take 325 mg by mouth daily.           . hydrochlorothiazide (,MICROZIDE/HYDRODIURIL,) 12.5 MG capsule      TAKE 1 CAPSULE EVERY MORNING   30 capsule   6     THANK YOU   . quinapril (ACCUPRIL) 20 MG tablet   Oral   Take 20 mg by mouth daily.           . simvastatin (ZOCOR) 10 MG tablet   Oral   Take 10 mg by mouth at bedtime.           . sitaGLIPtan-metformin (JANUMET) 50-500 MG per tablet   Oral   Take 2 tablets by mouth daily.            BP 132/80  Pulse 66  Temp(Src) 97.7 F (36.5 C) (Oral)  Resp 16  Ht 5\' 9"  (1.753 m)  Wt 180 lb (81.647 kg)  BMI 26.57 kg/m2  SpO2 98% Physical Exam  Nursing note and vitals reviewed. Constitutional: He is oriented to person, place, and time. He  appears well-developed and well-nourished.  HENT:  Head: Normocephalic and atraumatic.  Dry mucous membranes  Eyes: EOM are normal. Pupils are equal, round, and reactive to light.  Neck: Normal range of motion.  Cardiovascular: Normal rate, regular rhythm, normal heart sounds and intact distal pulses.   Pulmonary/Chest: Effort normal and breath sounds normal. No respiratory distress.  Abdominal: Soft. He exhibits no distension. There is no tenderness.  Musculoskeletal: Normal range of motion.  Neurological: He is alert and oriented to person, place, and time.  5/5 strength in major muscle groups of  bilateral upper and lower extremities. Speech normal. No facial asymetry.   Skin: Skin is warm and dry.  Psychiatric: He has a normal mood and affect. Judgment normal.    ED Course  Procedures (including critical care time) Labs Review Labs Reviewed  CBC WITH DIFFERENTIAL - Abnormal; Notable for the following:    Platelets 140 (*)    Neutrophils Relative % 79 (*)    Lymphocytes Relative 9 (*)    Lymphs Abs 0.6 (*)    All other components within normal limits  BASIC METABOLIC  PANEL - Abnormal; Notable for the following:    Glucose, Bld 129 (*)    GFR calc non Af Amer 61 (*)    GFR calc Af Amer 70 (*)    All other components within normal limits  TROPONIN I  URINALYSIS, ROUTINE W REFLEX MICROSCOPIC   Imaging Review Dg Chest 2 View  12/14/2013   CLINICAL DATA:  Dehydration and cough  EXAM: CHEST  2 VIEW  COMPARISON:  None available  FINDINGS: Normal heart size and mediastinal contours. No acute infiltrate or edema. No effusion or pneumothorax. No acute osseous findings.  IMPRESSION: No active cardiopulmonary disease.   Electronically Signed   By: Jorje Guild M.D.   On: 12/14/2013 03:41  I personally reviewed the imaging tests through PACS system I reviewed available ER/hospitalization records through the EMR   EKG Interpretation    Date/Time:  Friday December 14 2013 02:43:57 EST Ventricular Rate:  63 PR Interval:  212 QRS Duration: 104 QT Interval:  400 QTC Calculation: 409 R Axis:   -64 Text Interpretation:  Sinus rhythm with 1st degree A-V block Possible Left atrial enlargement Left anterior fascicular block Septal infarct (cited on or before 11-Nov-2011) Abnormal ECG When compared with ECG of 11-Nov-2011 14:43, No significant change was found Confirmed by Alyha Marines  MD, Presley Summerlin ([id number]) on 12/14/2013 3:28:36 AM            MDM  No diagnosis found. 3:47 AM Patient feels much better after IV fluids.  Chest x-ray without pneumonia.  Patient overall well-appearing.  Doubt stroke.  Patient encouraged to follow up closely with his primary care physician.  I've encouraged oral hydration over the next several days.  He and his family understand to return to the ER for new or worsening symptoms.    Hoy Morn, MD 12/14/13 [phone]

## 2013-12-14 NOTE — ED Notes (Signed)
Per EMS: Pt. Stumbled getting to bathroom and fell against the wall. Denies dizziness or LOC. Concentrated urine. Productive cough with yellow sputum starting Wed. Dry mucous membranes and poor skin turgor.

## 2016-12-07 ENCOUNTER — Encounter: Payer: Self-pay | Admitting: Internal Medicine

## 2016-12-10 ENCOUNTER — Encounter (INDEPENDENT_AMBULATORY_CARE_PROVIDER_SITE_OTHER): Payer: Self-pay | Admitting: *Deleted

## 2018-12-25 LAB — HEPATIC FUNCTION PANEL
ALT: 11 (ref 10–40)
AST: 21 (ref 14–40)

## 2018-12-26 LAB — BASIC METABOLIC PANEL
Glucose: 103
Potassium: 4.4 (ref 3.4–5.3)
Sodium: 140 (ref 137–147)

## 2019-08-06 ENCOUNTER — Other Ambulatory Visit: Payer: Self-pay

## 2019-08-06 ENCOUNTER — Encounter: Payer: Self-pay | Admitting: Family Medicine

## 2019-08-06 ENCOUNTER — Ambulatory Visit (INDEPENDENT_AMBULATORY_CARE_PROVIDER_SITE_OTHER): Payer: Medicare HMO | Admitting: Family Medicine

## 2019-08-06 VITALS — BP 158/70 | HR 58 | Temp 98.1°F | Ht 68.0 in | Wt 183.8 lb

## 2019-08-06 DIAGNOSIS — I10 Essential (primary) hypertension: Secondary | ICD-10-CM | POA: Diagnosis not present

## 2019-08-06 DIAGNOSIS — E785 Hyperlipidemia, unspecified: Secondary | ICD-10-CM

## 2019-08-06 DIAGNOSIS — E119 Type 2 diabetes mellitus without complications: Secondary | ICD-10-CM

## 2019-08-06 DIAGNOSIS — Z1211 Encounter for screening for malignant neoplasm of colon: Secondary | ICD-10-CM | POA: Diagnosis not present

## 2019-08-06 LAB — POCT URINALYSIS DIP (MANUAL ENTRY)
Bilirubin, UA: NEGATIVE
Blood, UA: NEGATIVE
Glucose, UA: NEGATIVE mg/dL
Ketones, POC UA: NEGATIVE mg/dL
Leukocytes, UA: NEGATIVE
Nitrite, UA: NEGATIVE
Protein Ur, POC: NEGATIVE mg/dL
Spec Grav, UA: 1.015 (ref 1.010–1.025)
Urobilinogen, UA: 0.2 E.U./dL
pH, UA: 5 (ref 5.0–8.0)

## 2019-08-06 LAB — POCT GLYCOSYLATED HEMOGLOBIN (HGB A1C): Hemoglobin A1C: 5.8 % — AB (ref 4.0–5.6)

## 2019-08-06 NOTE — Progress Notes (Signed)
New Patient Office Visit  Subjective:  Patient ID: Timothy Brennan, male    DOB: 1946-02-18  Age: 73 y.o. MRN: [id number]  CC:  Chief Complaint  Patient presents with  . New Patient (Initial Visit)  DM-6 month check HPI Timothy Brennan presents for HTN-takes meds daily-prior stent-no recent cardio eval Hyperlipidemia-115,60-takes simvastain daily DM-5.8%, no difficulty with ulcers, no numbness in the feet, no eye concerns-last appt 3 years ago-yearly Cardiology-h/o stent-No CP , no SOB Urology-ED-awaiting appt dematology-lesion -nevi -no removal per pt by derm Colonoscopy-2008 Past Medical History:  Diagnosis Date  . Diabetes mellitus   . Hyperlipidemia   . Hypertension    Past Surgical History:  Procedure Laterality Date  . CARDIAC CATHETERIZATION     with stent placement  . INGUINAL HERNIA REPAIR  11/15/2011   Procedure: HERNIA REPAIR INGUINAL ADULT;  Surgeon: Jamesetta So;  Location: [facility];  Service: General;  Laterality: Left;  . MOUTH SURGERY    . ROTATOR CUFF REPAIR     left   Family History  Problem Relation Age of Onset  . Cancer Mother   . Cancer Father   . Anesthesia problems Neg Hx   . Hypotension Neg Hx   . Malignant hyperthermia Neg Hx   . Pseudochol deficiency Neg Hx    Social History  Exercise-push mow property Socioeconomic History  . Marital status: Married    Spouse name: Not on file  . Number of children: Not on file  . Years of education: Not on file  . Highest education level: Not on file  Occupational History  . Not on file  Social Needs  . Financial resource strain: Not on file  . Food insecurity    Worry: Not on file    Inability: Not on file  . Transportation needs    Medical: Not on file    Non-medical: Not on file  Tobacco Use  . Smoking status: Former Smoker  Substance and Sexual Activity  . Alcohol use: No  . Drug use: No  . Sexual activity: Yes  Lifestyle  . Physical activity    Days per week: Not on file    Minutes  per session: Not on file  . Stress: Not on file  Relationships  . Social Herbalist on phone: Not on file    Gets together: Not on file    Attends religious service: Not on file    Active member of club or organization: Not on file    Attends meetings of clubs or organizations: Not on file    Relationship status: Not on file  . Intimate partner violence    Fear of current or ex partner: Not on file    Emotionally abused: Not on file    Physically abused: Not on file    Forced sexual activity: Not on file  Other Topics Concern  . Not on file  Social History Narrative  . Not on file   ROS Review of Systems  Constitutional: Negative.   HENT: Negative.   Eyes:       Reading glasses  Cardiovascular: Negative.   Gastrointestinal:       Stent  Endocrine:       DM  Genitourinary:       ED-needs urology  Musculoskeletal: Positive for arthralgias.  Skin:       Lesion-derm  Allergic/Immunologic: Negative.   Neurological: Negative.   Hematological: Negative.   Psychiatric/Behavioral: Negative.  Objective:   Today's Vitals: BP (!) 158/70 (BP Location: Left Arm, Patient Position: Sitting, Cuff Size: Normal)   Pulse (!) 58   Temp 98.1 F (36.7 C) (Oral)   Ht 5\' 8"  (1.727 m)   Wt 183 lb 12.8 oz (83.4 kg)   SpO2 96%   BMI 27.95 kg/m   Physical Exam Vitals signs reviewed.  Constitutional:      Appearance: Normal appearance.  HENT:     Head: Normocephalic and atraumatic.     Right Ear: Tympanic membrane, ear canal and external ear normal.     Left Ear: Tympanic membrane, ear canal and external ear normal.     Nose: Nose normal.     Mouth/Throat:     Mouth: Mucous membranes are moist.  Eyes:     Conjunctiva/sclera: Conjunctivae normal.  Neck:     Musculoskeletal: Normal range of motion and neck supple.  Cardiovascular:     Rate and Rhythm: Normal rate and regular rhythm.     Pulses: Normal pulses.     Heart sounds: Normal heart sounds.  Pulmonary:      Effort: Pulmonary effort is normal.     Breath sounds: Normal breath sounds.  Abdominal:     General: Abdomen is flat.  Musculoskeletal: Normal range of motion.  Neurological:     Mental Status: He is alert and oriented to person, place, and time.  Psychiatric:        Mood and Affect: Mood normal.     Assessment & Plan:   Outpatient Encounter Medications as of 08/06/2019  Medication Sig  . amLODipine (NORVASC) 10 MG tablet Take 10 mg by mouth daily.    Marland Kitchen aspirin EC 325 MG tablet Take 325 mg by mouth daily.    . hydrochlorothiazide (,MICROZIDE/HYDRODIURIL,) 12.5 MG capsule TAKE 1 CAPSULE EVERY MORNING  . quinapril (ACCUPRIL) 20 MG tablet Take 20 mg by mouth daily.    . simvastatin (ZOCOR) 10 MG tablet Take 10 mg by mouth at bedtime.    . sitaGLIPtan-metformin (JANUMET) 50-500 MG per tablet Take 2 tablets by mouth daily.     No facility-administered encounter medications on file as of 08/06/2019.    1. Diabetes mellitus without complication ([place]) Foot exam-normal - POCT HgB A1C-5.8% Janumet/metformin-stable  Continue exercise 2. Encounter for screening colonoscopy No concerns-last one in 2008 - Ambulatory referral to Gastroenterology  3. Essential hypertension HCTZ/accupril/amlodipine BMP 4. Hyperlipidemia, unspecified hyperlipidemia type zocor Follow-up: 6 months   Bertram Haddix Hannah Beat, MD

## 2019-08-06 NOTE — Patient Instructions (Signed)
Eye specialist appt-will call for appt time Urology-ordered by Dr. Zannie Kehr check on appt time Gastro referral-needs colonoscopy BMP, urine

## 2019-08-10 ENCOUNTER — Encounter: Payer: Self-pay | Admitting: Gastroenterology

## 2019-09-11 ENCOUNTER — Ambulatory Visit (INDEPENDENT_AMBULATORY_CARE_PROVIDER_SITE_OTHER): Payer: Medicare HMO | Admitting: Urology

## 2019-09-11 DIAGNOSIS — N5201 Erectile dysfunction due to arterial insufficiency: Secondary | ICD-10-CM | POA: Diagnosis not present

## 2019-09-12 ENCOUNTER — Encounter: Payer: Self-pay | Admitting: *Deleted

## 2019-09-12 ENCOUNTER — Ambulatory Visit (INDEPENDENT_AMBULATORY_CARE_PROVIDER_SITE_OTHER): Payer: Self-pay | Admitting: *Deleted

## 2019-09-12 ENCOUNTER — Other Ambulatory Visit: Payer: Self-pay

## 2019-09-12 DIAGNOSIS — Z1211 Encounter for screening for malignant neoplasm of colon: Secondary | ICD-10-CM

## 2019-09-12 MED ORDER — NA SULFATE-K SULFATE-MG SULF 17.5-3.13-1.6 GM/177ML PO SOLN: 1.0000 | Freq: Once | ORAL | 0 refills | Status: AC

## 2019-09-12 NOTE — Patient Instructions (Signed)
Timothy Brennan  [birth year] MRN: [id number]     Procedure Date: 11/28/2019 Time to register: 12:00 pm Place to register: [facility] Procedure Time: 1:00 pm Scheduled provider: Dr. Gala Romney    PREPARATION FOR COLONOSCOPY WITH SUPREP BOWEL PREP KIT  Note: Suprep Bowel Prep Kit is a split-dose (2day) regimen. Consumption of BOTH 6-ounce bottles is required for a complete prep.  Please notify us immediately if you are diabetic, take iron supplements, or if you are on Coumadin or any other blood thinners.  Please hold the following medications:  See letter                                                                                                                                                  2 DAYS BEFORE PROCEDURE:  DATE: 11/26/2019  DAY: Monday Begin clear liquid diet AFTER your lunch meal. NO SOLID FOODS after this point.  1 DAY BEFORE PROCEDURE:  DATE: 11/27/2019   DAY: Tuesday Continue clear liquids the entire day - NO SOLID FOOD.   Diabetic medications adjustments for today: See letter  At 6:00pm: Complete steps 1 through 4 below, using ONE (1) 6-ounce bottle, before going to bed. Step 1:  Pour ONE (1) 6-ounce bottle of SUPREP liquid into the mixing container.  Step 2:  Add cool drinking water to the 16 ounce line on the container and mix.  Note: Dilute the solution concentrate as directed prior to use. Step 3:  DRINK ALL the liquid in the container. Step 4:  You MUST drink an additional two (2) or more 16 ounce containers of water over the next one (1) hour.   Continue clear liquids.  DAY OF PROCEDURE:   DATE: 11/28/2019  DAY: Wednesday If you take medications for your heart, blood pressure, or breathing, you may take these medications.  Diabetic medications adjustments for today: See letter   5 hours before your procedure at 8:00 am  Step 1:  Pour ONE (1) 6-ounce bottle of SUPREP liquid into the mixing container.  Step 2:  Add cool drinking water to the 16  ounce line on the container and mix.  Note: Dilute the solution concentrate as directed prior to use. Step 3:  DRINK ALL the liquid in the container. Step 4:  You MUST drink an additional two (2) or more 16 ounce containers of water over the next one (1) hour. You MUST complete the final glass of water at least 3 hours before your colonoscopy.  Nothing by mouth past 10:00 am  You may take your morning medications with sip of water unless we have instructed otherwise.    Please see below for Dietary Information.  CLEAR LIQUIDS INCLUDE:  Water Jello (NOT red in color)   Ice Popsicles (NOT red in color)   Tea (sugar ok, no milk/cream) Powdered fruit flavored drinks  Coffee (sugar ok, no milk/cream) Gatorade/ Lemonade/ Kool-Aid  (NOT red in color)   Juice: apple, white grape, white cranberry Soft drinks  Clear bullion, consomme, broth (fat free beef/chicken/vegetable)  Carbonated beverages (any kind)  Strained chicken noodle soup Hard Candy   Remember: Clear liquids are liquids that will allow you to see your fingers on the other side of a clear glass. Be sure liquids are NOT red in color, and not cloudy, but CLEAR.  DO NOT EAT OR DRINK ANY OF THE FOLLOWING:  Dairy products of any kind   Cranberry juice Tomato juice / V8 juice   Grapefruit juice Orange juice     Red grape juice  Do not eat any solid foods, including such foods as: cereal, oatmeal, yogurt, fruits, vegetables, creamed soups, eggs, bread, crackers, pureed foods in a blender, etc.   HELPFUL HINTS FOR DRINKING PREP SOLUTION:   Make sure prep is extremely cold. Mix and refrigerate the the morning of the prep. You may also put in the freezer.   You may try mixing some Crystal Light or Country Time Lemonade if you prefer. Mix in small amounts; add more if necessary.  Try drinking through a straw  Rinse mouth with water or a mouthwash between glasses, to remove after-taste.  Try sipping on a cold beverage /ice/ popsicles  between glasses of prep.  Place a piece of sugar-free hard candy in mouth between glasses.  If you become nauseated, try consuming smaller amounts, or stretch out the time between glasses. Stop for 30-60 minutes, then slowly start back drinking.     OTHER INSTRUCTIONS  You will need a responsible adult at least 71 years of age to accompany you and drive you home. This person must remain in the waiting room during your procedure. The hospital will cancel your procedure if you do not have a responsible adult with you.   1. Wear loose fitting clothing that is easily removed. 2. Leave jewelry and other valuables at home.  3. Remove all body piercing jewelry and leave at home. 4. Total time from sign-in until discharge is approximately 2-3 hours. 5. You should go home directly after your procedure and rest. You can resume normal activities the day after your procedure. 6. The day of your procedure you should not:  Drive  Make legal decisions  Operate machinery  Drink alcohol  Return to work   You may call the office (Dept: [phone]) before 5:00pm, or page the doctor on call [phone]) after 5:00pm, for further instructions, if necessary.   Insurance Information YOU WILL NEED TO CHECK WITH YOUR INSURANCE COMPANY FOR THE BENEFITS OF COVERAGE YOU HAVE FOR THIS PROCEDURE.  UNFORTUNATELY, NOT ALL INSURANCE COMPANIES HAVE BENEFITS TO COVER ALL OR PART OF THESE TYPES OF PROCEDURES.  IT IS YOUR RESPONSIBILITY TO CHECK YOUR BENEFITS, HOWEVER, WE WILL BE GLAD TO ASSIST YOU WITH ANY CODES YOUR INSURANCE COMPANY MAY NEED.    PLEASE NOTE THAT MOST INSURANCE COMPANIES WILL NOT COVER A SCREENING COLONOSCOPY FOR PEOPLE UNDER THE AGE OF 50  IF YOU HAVE BCBS INSURANCE, YOU MAY HAVE BENEFITS FOR A SCREENING COLONOSCOPY BUT IF POLYPS ARE FOUND THE DIAGNOSIS WILL CHANGE AND THEN YOU MAY HAVE A DEDUCTIBLE THAT WILL NEED TO BE MET. SO PLEASE MAKE SURE YOU CHECK YOUR BENEFITS FOR A SCREENING  COLONOSCOPY AS WELL AS A DIAGNOSTIC COLONOSCOPY.

## 2019-09-12 NOTE — Addendum Note (Signed)
Addended by: Metro Kung on: 09/12/2019 04:13 PM   Modules accepted: Orders, SmartSet

## 2019-09-12 NOTE — Progress Notes (Signed)
Okay to schedule.  Day before procedure: Janumet 1 tablet daily AM of procedure: Hold Janumet.

## 2019-09-12 NOTE — Progress Notes (Signed)
Mailed letter to pt with diabetes medication adjustments.   

## 2019-09-12 NOTE — Progress Notes (Signed)
Gastroenterology Pre-Procedure Review  Request Date: 09/12/2019 Requesting Physician: Dr. Holly Bodily, Last TCS done 01/27/2007 by NUR, no polyps  PATIENT REVIEW QUESTIONS: The patient responded to the following health history questions as indicated:    1. Diabetes Melitis: yes 2. Joint replacements in the past 12 months: no 3. Major health problems in the past 3 months: no 4. Has an artificial valve or MVP: no 5. Has a defibrillator: no 6. Has been advised in past to take antibiotics in advance of a procedure like teeth cleaning: no 7. Family history of colon cancer: no 8. Alcohol Use: no 9. Illicit drug Use: no 10. History of sleep apnea: no 11. History of coronary artery or other vascular stents placed within the last 12 months: no 12. History of any prior anesthesia complications: no 13. There is no height or weight on file to calculate BMI. ht: 5'8 wt: 179 lbs    MEDICATIONS & ALLERGIES:    Patient reports the following regarding taking any blood thinners:   Plavix? no Aspirin? yes Coumadin? no Brilinta? no Xarelto? no Eliquis? no Pradaxa? no Savaysa? no Effient? no  Patient confirms/reports the following medications:  Current Outpatient Medications  Medication Sig Dispense Refill  . amLODipine (NORVASC) 10 MG tablet Take 10 mg by mouth daily.      Marland Kitchen aspirin EC 325 MG tablet Take 325 mg by mouth daily.      . hydrochlorothiazide (,MICROZIDE/HYDRODIURIL,) 12.5 MG capsule TAKE 1 CAPSULE EVERY MORNING 30 capsule 6  . quinapril (ACCUPRIL) 20 MG tablet Take 20 mg by mouth daily.      . simvastatin (ZOCOR) 10 MG tablet Take 10 mg by mouth at bedtime.      . sitaGLIPtan-metformin (JANUMET) 50-500 MG per tablet Take 2 tablets by mouth daily.       No current facility-administered medications for this visit.     Patient confirms/reports the following allergies:  Allergies  Allergen Reactions  . Penicillins Hives and Nausea And Vomiting    No orders of the defined types were  placed in this encounter.   AUTHORIZATION INFORMATION Primary Insurance: [employer],  ID #: [id number],  Group #: [id number] Pre-Cert / Auth required: No, not required  SCHEDULE INFORMATION: Procedure has been scheduled as follows:  Date: 11/28/2019, Time: 1:00 Location: [facility] with Dr. Gala Romney  This Gastroenterology Pre-Precedure Review Form is being routed to the following provider(s):  Neil Crouch, PA-C

## 2019-10-10 ENCOUNTER — Other Ambulatory Visit: Payer: Self-pay

## 2019-10-10 DIAGNOSIS — Z20822 Contact with and (suspected) exposure to covid-19: Secondary | ICD-10-CM

## 2019-10-13 LAB — NOVEL CORONAVIRUS, NAA: SARS-CoV-2, NAA: NOT DETECTED

## 2019-10-17 ENCOUNTER — Telehealth: Payer: Self-pay | Admitting: Internal Medicine

## 2019-10-17 ENCOUNTER — Telehealth: Payer: Self-pay

## 2019-10-17 NOTE — Telephone Encounter (Signed)
Patient given negative result and verbalized understanding  

## 2019-10-17 NOTE — Telephone Encounter (Signed)
Spoke with pt and he is aware that his prep kit was sent to [facility] in Nov.  Pt said that he would go pick it up.  Pt said that he did not have any other questions right now.

## 2019-10-17 NOTE — Telephone Encounter (Signed)
Pt is scheduled procedure with RMR on 11/28/2019. He has questions for the nurse and needs his prep called into [facility] in [place], [phone]

## 2019-11-26 ENCOUNTER — Other Ambulatory Visit (HOSPITAL_COMMUNITY)
Admission: RE | Admit: 2019-11-26 | Discharge: 2019-11-26 | Disposition: A | Discharge status: Home / Self Care | Payer: Medicare HMO | Source: Ambulatory Visit | Attending: Internal Medicine | Admitting: Internal Medicine

## 2019-11-26 ENCOUNTER — Other Ambulatory Visit: Payer: Self-pay

## 2019-11-26 DIAGNOSIS — Z01812 Encounter for preprocedural laboratory examination: Secondary | ICD-10-CM | POA: Diagnosis present

## 2019-11-26 DIAGNOSIS — Z20822 Contact with and (suspected) exposure to covid-19: Secondary | ICD-10-CM | POA: Insufficient documentation

## 2019-11-26 LAB — SARS CORONAVIRUS 2 (TAT 6-24 HRS): SARS Coronavirus 2: NEGATIVE

## 2019-11-28 ENCOUNTER — Other Ambulatory Visit: Payer: Self-pay

## 2019-11-28 ENCOUNTER — Encounter (HOSPITAL_COMMUNITY): Payer: Self-pay | Admitting: Internal Medicine

## 2019-11-28 ENCOUNTER — Encounter (HOSPITAL_COMMUNITY)
Admission: RE | Disposition: A | Discharge status: Home / Self Care | Payer: Self-pay | Source: Home / Self Care | Attending: Internal Medicine

## 2019-11-28 ENCOUNTER — Encounter: Payer: Self-pay | Admitting: Internal Medicine

## 2019-11-28 ENCOUNTER — Ambulatory Visit (HOSPITAL_COMMUNITY)
Admission: RE | Admit: 2019-11-28 | Discharge: 2019-11-28 | Disposition: A | Discharge status: Home / Self Care | Payer: Medicare HMO | Attending: Internal Medicine | Admitting: Internal Medicine

## 2019-11-28 DIAGNOSIS — K6389 Other specified diseases of intestine: Secondary | ICD-10-CM | POA: Diagnosis not present

## 2019-11-28 DIAGNOSIS — Z7984 Long term (current) use of oral hypoglycemic drugs: Secondary | ICD-10-CM | POA: Insufficient documentation

## 2019-11-28 DIAGNOSIS — Q438 Other specified congenital malformations of intestine: Secondary | ICD-10-CM | POA: Insufficient documentation

## 2019-11-28 DIAGNOSIS — I10 Essential (primary) hypertension: Secondary | ICD-10-CM | POA: Diagnosis not present

## 2019-11-28 DIAGNOSIS — Z1211 Encounter for screening for malignant neoplasm of colon: Secondary | ICD-10-CM | POA: Insufficient documentation

## 2019-11-28 DIAGNOSIS — Z7982 Long term (current) use of aspirin: Secondary | ICD-10-CM | POA: Insufficient documentation

## 2019-11-28 DIAGNOSIS — D123 Benign neoplasm of transverse colon: Secondary | ICD-10-CM | POA: Diagnosis not present

## 2019-11-28 DIAGNOSIS — K633 Ulcer of intestine: Secondary | ICD-10-CM | POA: Diagnosis not present

## 2019-11-28 DIAGNOSIS — Z79899 Other long term (current) drug therapy: Secondary | ICD-10-CM | POA: Diagnosis not present

## 2019-11-28 DIAGNOSIS — E785 Hyperlipidemia, unspecified: Secondary | ICD-10-CM | POA: Diagnosis not present

## 2019-11-28 DIAGNOSIS — Z87891 Personal history of nicotine dependence: Secondary | ICD-10-CM | POA: Insufficient documentation

## 2019-11-28 DIAGNOSIS — K635 Polyp of colon: Secondary | ICD-10-CM | POA: Diagnosis not present

## 2019-11-28 DIAGNOSIS — E119 Type 2 diabetes mellitus without complications: Secondary | ICD-10-CM | POA: Diagnosis not present

## 2019-11-28 HISTORY — PX: POLYPECTOMY: SHX5525

## 2019-11-28 HISTORY — PX: COLONOSCOPY: SHX5424

## 2019-11-28 LAB — GLUCOSE, CAPILLARY: Glucose-Capillary: 93 mg/dL (ref 70–99)

## 2019-11-28 SURGERY — COLONOSCOPY
Anesthesia: Moderate Sedation

## 2019-11-28 MED ORDER — SPOT INK MARKER SYRINGE KIT
PACK | SUBMUCOSAL | Status: AC
  Filled 2019-11-28: qty 5

## 2019-11-28 MED ORDER — MEPERIDINE HCL 100 MG/ML IJ SOLN
INTRAMUSCULAR | Status: DC | PRN
  Administered 2019-11-28: 15 mg via INTRAVENOUS
  Administered 2019-11-28: 25 mg via INTRAVENOUS

## 2019-11-28 MED ORDER — ONDANSETRON HCL 4 MG/2ML IJ SOLN
INTRAMUSCULAR | Status: AC
  Filled 2019-11-28: qty 2

## 2019-11-28 MED ORDER — ONDANSETRON HCL 4 MG/2ML IJ SOLN
INTRAMUSCULAR | Status: DC | PRN
  Administered 2019-11-28: 4 mg via INTRAVENOUS

## 2019-11-28 MED ORDER — SODIUM CHLORIDE 0.9 % IV SOLN: INTRAVENOUS | Status: DC

## 2019-11-28 MED ORDER — MEPERIDINE HCL 50 MG/ML IJ SOLN
INTRAMUSCULAR | Status: AC
  Filled 2019-11-28: qty 1

## 2019-11-28 MED ORDER — STERILE WATER FOR IRRIGATION IR SOLN
Status: DC | PRN
  Administered 2019-11-28: 1.5 mL

## 2019-11-28 MED ORDER — MIDAZOLAM HCL 5 MG/5ML IJ SOLN
INTRAMUSCULAR | Status: DC | PRN
  Administered 2019-11-28: 1 mg via INTRAVENOUS
  Administered 2019-11-28: 2 mg via INTRAVENOUS
  Administered 2019-11-28 (×2): 1 mg via INTRAVENOUS
  Administered 2019-11-28: 2 mg via INTRAVENOUS

## 2019-11-28 MED ORDER — SPOT INK MARKER SYRINGE KIT
PACK | SUBMUCOSAL | Status: DC | PRN
  Administered 2019-11-28: 2 mL via SUBMUCOSAL

## 2019-11-28 MED ORDER — MIDAZOLAM HCL 5 MG/5ML IJ SOLN
INTRAMUSCULAR | Status: AC
  Filled 2019-11-28: qty 10

## 2019-11-28 NOTE — Op Note (Signed)
[facility] Patient Name: Timothy Brennan Procedure Date: 11/28/2019 1:01 PM MRN: [id number] Date of Birth: Feb 19, 1946 Attending MD: Norvel Richards , MD CSN: [id number] Age: 74 Admit Type: Outpatient Procedure:                Colonoscopy Indications:              Screening for colorectal malignant neoplasm Providers:                Norvel Richards, MD, Charlsie Quest. Theda Sers RN, RN,                            Aram Candela Referring MD:              Medicines:                Midazolam 7 mg IV, Meperidine 40 mg IV Complications:            No immediate complications. Estimated Blood Loss:     Estimated blood loss was minimal. Procedure:                Pre-Anesthesia Assessment:                           - Prior to the procedure, a History and Physical                            was performed, and patient medications and                            allergies were reviewed. The patient's tolerance of                            previous anesthesia was also reviewed. The risks                            and benefits of the procedure and the sedation                            options and risks were discussed with the patient.                            All questions were answered, and informed consent                            was obtained. Prior Anticoagulants: The patient has                            taken no previous anticoagulant or antiplatelet                            agents except for aspirin. ASA Grade Assessment:                            III - A patient with severe systemic disease. After  reviewing the risks and benefits, the patient was                            deemed in satisfactory condition to undergo the                            procedure.                           After obtaining informed consent, the colonoscope                            was passed under direct vision. Throughout the                            procedure, the  patient's blood pressure, pulse, and                            oxygen saturations were monitored continuously. The                            [id number] scope was introduced through                            the anus and advanced to the the cecum, identified                            by appendiceal orifice and ileocecal valve. The                            colonoscopy was performed without difficulty. The                            patient tolerated the procedure well. The quality                            of the bowel preparation was adequate. Scope In: 1:25:57 PM Scope Out: 2:25:45 PM Scope Withdrawal Time: 0 hours 56 minutes 22 seconds  Total Procedure Duration: 0 hours 59 minutes 48 seconds  Findings:      The perianal and digital rectal examinations were normal. Redundant       colon. External abdominal pressure required to reach the cecum.      In the general vicinity of the distal transverse/splenic flexure, I       found a sessile, polypoid lesion spanning a fold approximately 4 x 3 cm.       Please see photos. Initially, was felt to have lifted nicely away from       the colonic mucosa with the Eloview injected submucosally. Piecemeal hot       snare polypectomy ensued. A good portion of this polyp was removed. In       between 2 folds, there was a depressed area of polypoid mucosa       proximally 1.5 x 1.5 cm in dimensions remaining. I injected additional       Eloviewe dynamically in and around this polypoid area. It  lifted to a       modest degree. I was unable to adequately engage the remainder of this       polyp with a snare. Subsequently, biopsies of the central area of this       residual polypoid mass were taken. Please this area was HARD to biopsy       forcep palpation. I did apply 1 hemostasis clip to the lateral margin of       the polypectomy site to ensure good hemostasis. Finally, I demarcated       this lesion in the colon with 2 ink spots 2 cm  distal to the distal       extent and 2 ink spots 2 cm proximal to the proximal extent of the       lesion. Impression:               -Large polyp in the vicinity of the distal                            transverse/splenic flexure segments. Debulked and                            biopsied as described above. Hemostasis clip x1.                            Site demarcated with ink.                           - No specimens collected. This lesion will require                            surgical resection to get it all out. I am                            concerned this lesion may harbor invasive carcinoma Moderate Sedation:      Moderate (conscious) sedation was administered by the endoscopy nurse       and supervised by the endoscopist. The following parameters were       monitored: oxygen saturation, heart rate, blood pressure, respiratory       rate, EKG, adequacy of pulmonary ventilation, and response to care.       Total physician intraservice time was 67 minutes. Recommendation:           - Patient has a contact number available for                            emergencies. The signs and symptoms of potential                            delayed complications were discussed with the                            patient. Return to normal activities tomorrow.                            Written discharge instructions were provided to the  patient.                           - Advance diet as tolerated.                           - No aspirin, ibuprofen, naproxen, or other                            non-steroidal anti-inflammatory drugs for 10 days                            after polyp removal. Follow-up on biopsy. Discussed                            with wife. They would like to be referred to Dr.                            Aviva Signs. Procedure Code(s):        --- Professional ---                           [phone], Colonoscopy, flexible; diagnostic, including                             collection of specimen(s) by brushing or washing,                            when performed (separate procedure)                           99153, Moderate sedation; each additional 15                            minutes intraservice time                           99153, Moderate sedation; each additional 15                            minutes intraservice time                           99153, Moderate sedation; each additional 15                            minutes intraservice time                           G0500, Moderate sedation services provided by the                            same physician or other qualified health care                            professional performing a gastrointestinal  endoscopic service that sedation supports,                            requiring the presence of an independent trained                            observer to assist in the monitoring of the                            patient's level of consciousness and physiological                            status; initial 15 minutes of intra-service time;                            patient age 50 years or older (additional time may                            be reported with [phone], as appropriate) Diagnosis Code(s):        --- Professional ---                           Z12.11, Encounter for screening for malignant                            neoplasm of colon                           K63.5, Polyp of colon CPT copyright 2019 American [employer] Association. All rights reserved. The codes documented in this report are preliminary and upon coder review may  be revised to meet current compliance requirements. Cristopher Estimable. Tien Aispuro, MD Norvel Richards, MD 11/28/2019 3:44:57 PM This report has been signed electronically. Number of Addenda: 0

## 2019-11-28 NOTE — H&P (Signed)
@LOGO @   Primary Care Physician:  Maryruth Hancock, MD Primary Gastroenterologist:  Dr. Gala Romney  Pre-Procedure History & Physical: HPI:  Timothy Brennan is a 74 y.o. male is here for a screening colonoscopy.  Negative colonoscopy 2008.  No bowel symptoms.  No family history of colon cancer.  Past Medical History:  Diagnosis Date  . Diabetes mellitus   . Hyperlipidemia   . Hypertension     Past Surgical History:  Procedure Laterality Date  . CARDIAC CATHETERIZATION     with stent placement  . INGUINAL HERNIA REPAIR  11/15/2011   Procedure: HERNIA REPAIR INGUINAL ADULT;  Surgeon: Jamesetta So;  Location: [facility];  Service: General;  Laterality: Left;  . MOUTH SURGERY    . ROTATOR CUFF REPAIR     left    Prior to Admission medications   Medication Sig Start Date End Date Taking? Authorizing Provider  amLODipine (NORVASC) 10 MG tablet Take 10 mg by mouth daily.     Yes [provider]  aspirin EC 325 MG tablet Take 325 mg by mouth daily.     Yes [provider]  atorvastatin (LIPITOR) 10 MG tablet Take 10 mg by mouth daily. 10/05/19  Yes [provider]  hydrochlorothiazide (,MICROZIDE/HYDRODIURIL,) 12.5 MG capsule TAKE 1 CAPSULE EVERY MORNING Patient taking differently: Take 12.5 mg by mouth daily.  04/10/11  Yes Wall, Marijo Conception, MD  quinapril (ACCUPRIL) 20 MG tablet Take 20 mg by mouth daily.     Yes [provider]  sitaGLIPtan-metformin (JANUMET) 50-500 MG per tablet Take 1 tablet by mouth 2 (two) times daily with a meal.    Yes [provider]    Allergies as of 09/12/2019 - Review Complete 09/12/2019  Allergen Reaction Noted  . Penicillins Hives and Nausea And Vomiting 12/14/2013    Family History  Problem Relation Age of Onset  . Cancer Mother   . Cancer Father   . Anesthesia problems Neg Hx   . Hypotension Neg Hx   . Malignant hyperthermia Neg Hx   . Pseudochol deficiency Neg Hx   . Colon cancer Neg Hx     Social History    Socioeconomic History  . Marital status: Married    Spouse name: Not on file  . Number of children: Not on file  . Years of education: Not on file  . Highest education level: Not on file  Occupational History  . Not on file  Tobacco Use  . Smoking status: Former Research scientist (life sciences)  . Smokeless tobacco: Never Used  Substance and Sexual Activity  . Alcohol use: No  . Drug use: No  . Sexual activity: Yes  Other Topics Concern  . Not on file  Social History Narrative  . Not on file   Social Determinants of Health   Financial Resource Strain:   . Difficulty of Paying Living Expenses: Not on file  Food Insecurity:   . Worried About Charity fundraiser in the Last Year: Not on file  . Ran Out of Food in the Last Year: Not on file  Transportation Needs:   . Lack of Transportation (Medical): Not on file  . Lack of Transportation (Non-Medical): Not on file  Physical Activity:   . Days of Exercise per Week: Not on file  . Minutes of Exercise per Session: Not on file  Stress:   . Feeling of Stress : Not on file  Social Connections:   . Frequency of Communication with Friends and Family:  Not on file  . Frequency of Social Gatherings with Friends and Family: Not on file  . Attends Religious Services: Not on file  . Active Member of Clubs or Organizations: Not on file  . Attends Archivist Meetings: Not on file  . Marital Status: Not on file  Intimate Partner Violence:   . Fear of Current or Ex-Partner: Not on file  . Emotionally Abused: Not on file  . Physically Abused: Not on file  . Sexually Abused: Not on file    Review of Systems: See HPI, otherwise negative ROS  Physical Exam: BP (!) 156/65   Pulse 65   Temp 98.2 F (36.8 C) (Oral)   Resp 17   Ht 5\' 8"  (1.727 m)   Wt 79.4 kg   SpO2 100%   BMI 26.61 kg/m  General:   Alert,  Well-developed, well-nourished, pleasant and cooperative in NAD Lungs:  Clear throughout to auscultation.   No wheezes, crackles, or  rhonchi. No acute distress. Heart:  Regular rate and rhythm; no murmurs, clicks, rubs,  or gallops. Abdomen:  Soft, nontender and nondistended. No masses, hepatosplenomegaly or hernias noted. Normal bowel sounds, without guarding, and without rebound.   Msk:  Symmetrical without gross deformities. Normal posture.  Impression/Plan: Timothy Brennan is now here to undergo a screening colonoscopy.  Average rescreening examination. Risks, benefits, limitations, imponderables and alternatives regarding colonoscopy have been reviewed with the patient. Questions have been answered. All parties agreeable.     Notice:  This dictation was prepared with Dragon dictation along with smaller phrase technology. Any transcriptional errors that result from this process are unintentional and may not be corrected upon review.

## 2019-11-28 NOTE — Discharge Instructions (Signed)
  Colonoscopy Discharge Instructions  Read the instructions outlined below and refer to this sheet in the next few weeks. These discharge instructions provide you with general information on caring for yourself after you leave the hospital. Your doctor may also give you specific instructions. While your treatment has been planned according to the most current medical practices available, unavoidable complications occasionally occur. If you have any problems or questions after discharge, call Dr. Gala Romney at [phone]. ACTIVITY  You may resume your regular activity, but move at a slower pace for the next 24 hours.   Take frequent rest periods for the next 24 hours.   Walking will help get rid of the air and reduce the bloated feeling in your belly (abdomen).   No driving for 24 hours (because of the medicine (anesthesia) used during the test).    Do not sign any important legal documents or operate any machinery for 24 hours (because of the anesthesia used during the test).  NUTRITION  Drink plenty of fluids.   You may resume your normal diet as instructed by your doctor.   Begin with a light meal and progress to your normal diet. Heavy or fried foods are harder to digest and may make you feel sick to your stomach (nauseated).   Avoid alcoholic beverages for 24 hours or as instructed.  MEDICATIONS  You may resume your normal medications unless your doctor tells you otherwise.  WHAT YOU CAN EXPECT TODAY  Some feelings of bloating in the abdomen.   Passage of more gas than usual.   Spotting of blood in your stool or on the toilet paper.  IF YOU HAD POLYPS REMOVED DURING THE COLONOSCOPY:  No aspirin products for 7 days or as instructed.   No alcohol for 7 days or as instructed.   Eat a soft diet for the next 24 hours.  FINDING OUT THE RESULTS OF YOUR TEST Not all test results are available during your visit. If your test results are not back during the visit, make an appointment  with your caregiver to find out the results. Do not assume everything is normal if you have not heard from your caregiver or the medical facility. It is important for you to follow up on all of your test results.  SEEK IMMEDIATE MEDICAL ATTENTION IF:  You have more than a spotting of blood in your stool.   Your belly is swollen (abdominal distention).   You are nauseated or vomiting.   You have a temperature over 101.   You have abdominal pain or discomfort that is severe or gets worse throughout the day.    I found a large polyp in your colon.  It was biopsied and partially removed.  It cannot be entirely removed by colonoscopy.  You will need surgery to get this out completely.  Stop aspirin and no NSAIDs for 10 days; may resume aspirin after 10 days  Further recommendations to follow pending review of pathology report  No MRI until clip gone  We will be planning to make an appointment to see Dr. Arnoldo Morale for surgery  At patient request, I spoke to Hassan Rowan, wife at [phone] -reviewed results.

## 2019-11-30 LAB — SURGICAL PATHOLOGY

## 2019-12-04 ENCOUNTER — Other Ambulatory Visit: Payer: Self-pay

## 2019-12-04 ENCOUNTER — Ambulatory Visit: Payer: Medicare HMO | Attending: Internal Medicine

## 2019-12-04 DIAGNOSIS — Z20822 Contact with and (suspected) exposure to covid-19: Secondary | ICD-10-CM

## 2019-12-05 ENCOUNTER — Telehealth: Payer: Self-pay | Admitting: *Deleted

## 2019-12-05 LAB — NOVEL CORONAVIRUS, NAA: SARS-CoV-2, NAA: NOT DETECTED

## 2019-12-06 ENCOUNTER — Other Ambulatory Visit: Payer: Self-pay

## 2019-12-06 ENCOUNTER — Ambulatory Visit: Payer: Medicare HMO | Admitting: General Surgery

## 2019-12-06 ENCOUNTER — Encounter: Payer: Self-pay | Admitting: General Surgery

## 2019-12-06 VITALS — BP 155/67 | HR 63 | Temp 97.9°F | Resp 16 | Ht 68.0 in | Wt 177.0 lb

## 2019-12-06 DIAGNOSIS — D126 Benign neoplasm of colon, unspecified: Secondary | ICD-10-CM

## 2019-12-06 MED ORDER — NEOMYCIN SULFATE 500 MG PO TABS: 1000.0000 mg | ORAL_TABLET | Freq: Three times a day (TID) | ORAL | 0 refills | Status: DC

## 2019-12-06 MED ORDER — GOLYTELY 236 G PO SOLR: 4000.0000 mL | Freq: Once | ORAL | 0 refills | Status: AC

## 2019-12-06 MED ORDER — METRONIDAZOLE 500 MG PO TABS: 500.0000 mg | ORAL_TABLET | Freq: Three times a day (TID) | ORAL | 0 refills | Status: DC

## 2019-12-06 NOTE — Progress Notes (Signed)
Timothy Brennan; [id number]; 1946-01-11   HPI Patient is a 74 year old black male who was referred to my care by Drs. Corum and Rourk for evaluation treatment of a dysplastic polyp seen on screening colonoscopy.  Patient underwent colonoscopy and was found to have dysplastic polyp at the splenic flexure along with a hard base which could not be removed endoscopically.  Final pathology revealed highly dysplastic lesion in this area with the inability to rule out cancer.  Patient denies any blood per rectum.  This was just a screening colonoscopy.  He has 0 out of 10 abdominal pain. Past Medical History:  Diagnosis Date  . Diabetes mellitus   . Hyperlipidemia   . Hypertension     Past Surgical History:  Procedure Laterality Date  . CARDIAC CATHETERIZATION     with stent placement  . COLONOSCOPY N/A 11/28/2019   Procedure: COLONOSCOPY;  Surgeon: Daneil Dolin, MD;  Location: [facility];  Service: Endoscopy;  Laterality: N/A;  1:00  . INGUINAL HERNIA REPAIR  11/15/2011   Procedure: HERNIA REPAIR INGUINAL ADULT;  Surgeon: Jamesetta So;  Location: [facility];  Service: General;  Laterality: Left;  . MOUTH SURGERY    . POLYPECTOMY  11/28/2019   Procedure: POLYPECTOMY;  Surgeon: Daneil Dolin, MD;  Location: [facility];  Service: Endoscopy;;  . ROTATOR CUFF REPAIR     left    Family History  Problem Relation Age of Onset  . Cancer Mother   . Cancer Father   . Anesthesia problems Neg Hx   . Hypotension Neg Hx   . Malignant hyperthermia Neg Hx   . Pseudochol deficiency Neg Hx   . Colon cancer Neg Hx     Current Outpatient Medications on File Prior to Visit  Medication Sig Dispense Refill  . amLODipine (NORVASC) 10 MG tablet Take 10 mg by mouth daily.      Marland Kitchen aspirin 325 MG tablet Take 325 mg by mouth daily.    Marland Kitchen atorvastatin (LIPITOR) 10 MG tablet Take 10 mg by mouth daily.    . hydrochlorothiazide (,MICROZIDE/HYDRODIURIL,) 12.5 MG capsule TAKE 1 CAPSULE EVERY MORNING (Patient taking  differently: Take 12.5 mg by mouth daily. ) 30 capsule 6  . quinapril (ACCUPRIL) 20 MG tablet Take 20 mg by mouth daily.      . sitaGLIPtan-metformin (JANUMET) 50-500 MG per tablet Take 1 tablet by mouth 2 (two) times daily with a meal.      No current facility-administered medications on file prior to visit.    Allergies  Allergen Reactions  . Penicillins Hives and Nausea And Vomiting    Did it involve swelling of the face/tongue/throat, SOB, or low BP? No Did it involve sudden or severe rash/hives, skin peeling, or any reaction on the inside of your mouth or nose? No Did you need to seek medical attention at a hospital or doctor's office? No When did it last happen? If all above answers are "NO", may proceed with cephalosporin use.     Social History   Substance and Sexual Activity  Alcohol Use No    Social History   Tobacco Use  Smoking Status Former Smoker  Smokeless Tobacco Never Used    Review of Systems  Constitutional: Negative.   HENT: Negative.   Eyes: Negative.   Respiratory: Negative.   Cardiovascular: Negative.   Gastrointestinal: Negative.   Genitourinary: Negative.   Musculoskeletal: Negative.   Skin: Negative.   Neurological: Negative.   Endo/Heme/Allergies: Negative.   Psychiatric/Behavioral:  Negative.     Objective   Vitals:   12/06/19 1103  BP: (!) 155/67  Pulse: 63  Resp: 16  Temp: 97.9 F (36.6 C)  SpO2: 98%    Physical Exam Vitals reviewed.  Constitutional:      Appearance: Normal appearance. He is normal weight. He is not ill-appearing.  HENT:     Head: Normocephalic and atraumatic.  Cardiovascular:     Rate and Rhythm: Normal rate and regular rhythm.     Heart sounds: Normal heart sounds. No murmur. No friction rub. No gallop.   Pulmonary:     Effort: Pulmonary effort is normal. No respiratory distress.     Breath sounds: Normal breath sounds. No stridor. No wheezing, rhonchi or rales.  Abdominal:     General:  Abdomen is flat. Bowel sounds are normal. There is no distension.     Palpations: Abdomen is soft. There is no mass.     Tenderness: There is no abdominal tenderness. There is no guarding or rebound.     Hernia: No hernia is present.  Skin:    General: Skin is warm and dry.  Neurological:     Mental Status: He is alert and oriented to person, place, and time.   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  The risks and benefits of the procedure including bleeding, infection, the possible need for blood transfusion, and the possibility of an anastomotic leak were fully explained to the patient, who gave informed consent.  Neomycin, Flagyl, and GoLYTELY preoperative bowel prep has been prescribed.  Patient will continue holding aspirin.

## 2019-12-06 NOTE — Patient Instructions (Signed)
Your procedure is scheduled on: 12/12/2019  Report to [facility] at   8:45  AM.  Call this number if you have problems the morning of surgery: [phone]   Remember:   Do not Eat or Drink after midnight              No smoking the morning of surgery              Follow bowel prep instructions given by Dr Arnoldo Morale  :  Take these medicines the morning of surgery with A SIP OF WATER: Amlodipine, Neomycin, and flagyl   Do not wear jewelry, make-up or nail polish.  Do not wear lotions, powders, or perfumes. You may wear deodorant.  Do not shave 48 hours prior to surgery. Men may shave face and neck.  Do not bring valuables to the hospital.  Contacts, dentures or bridgework may not be worn into surgery.  Leave suitcase in the car. After surgery it may be brought to your room.  For patients admitted to the hospital, checkout time is 11:00 AM the day of discharge.   Patients discharged the day of surgery will not be allowed to drive home.    Special Instructions: Shower using CHG night before surgery and shower the day of surgery use CHG.  Use special wash - you have one bottle of CHG for all showers.  You should use approximately 1/2 of the bottle for each shower.  Open Colectomy An open colectomy is surgery to remove part or all of the large intestine (colon). This procedure may be used to treat several conditions, including:  Inflammation and infection of the colon (diverticulitis).  Tumors or masses in the colon.  Inflammatory bowel disease, such as Crohn disease or ulcerative colitis.  Bleeding from the colon.  Blockage or obstruction of the colon. Tell a health care provider about:  Any allergies you have.  All medicines you are taking, including vitamins, herbs, eye drops, creams, and over-the-counter medicines.  Any problems you or family members have had with anesthetic medicines.  Any blood disorders you have.  Any surgeries you have had.  Any medical conditions  you have.  Whether you are pregnant or may be pregnant.  Whether you smoke or use tobacco products. These can affect your body's reaction to anesthesia. What are the risks? Generally, this is a safe procedure. However, problems may occur, including:  Infection.  Bleeding.  Allergic reactions to medicines.  Damage to other structures or organs.  Pneumonia.  The incision opening up.  Tissues from inside the abdomen bulging through the incision (hernia).  Reopening of the colon where it was stitched or stapled together.  A blood clot forming in a vein and traveling to the lungs.  Future blockage of the small intestine from scar tissue. What happens before the procedure? Staying hydrated Follow instructions from your health care provider about hydration, which may include:  Up to 2 hours before the procedure - you may continue to drink clear liquids, such as water, clear fruit juice, black coffee, and plain tea. Eating and drinking restrictions Follow instructions from your health care provider about eating and drinking, which may include:  8 hours before the procedure - stop eating heavy meals or foods such as meat, fried foods, or fatty foods.  6 hours before the procedure - stop eating light meals or foods, such as toast or cereal.  6 hours before the procedure - stop drinking milk or drinks that contain milk.  2 hours before the procedure - stop drinking clear liquids. Bowel prep In some cases, you may be prescribed an oral bowel prep to clean out your colon. If so:  Take it as told by your health care provider. Starting the day before your procedure, you may need to drink a large amount of medicated liquid. The liquid will cause you to have multiple loose stools until your stool is almost clear or light green.  Follow instructions from your health care provider about eating and drinking restrictions during bowel prep. Medicines  Ask your health care provider  about: ? Changing or stopping your regular medicines or vitamins. This is especially important if you are taking diabetes medicines, blood thinners, or vitamin E. ? Taking medicines such as aspirin and ibuprofen. These medicines can thin your blood. Do not take these medicines before your procedure if your health care provider instructs you not to.  If you were prescribed an antibiotic medicine, take it as told by your health care provider. General instructions  Bring loose-fitting, comfortable clothing and slip-on shoes that you can put on without bending over.  Make sure to see your health care provider for any tests that you need before the procedure, such as: ? Blood tests. ? A test to check the heart's rhythm (electrocardiogram, ECG). ? A CT scan of your abdomen. ? Urine tests. ? Colonoscopy.  Plan to have someone take you home from the hospital or clinic.  Arrange for someone to help you with your activities during your recovery. What happens during the procedure?  To reduce your risk of infection: ? Your health care team will wash or sanitize their hands. ? Your skin will be washed with soap. ? Hair may be removed from the surgical area.  An IV tube will be inserted into one of your veins. The tube will be used to give you medicines and fluids.  You will be given a medicine to make you fall asleep (general anesthetic). You may also be given a medicine to help you relax (sedative).  Small monitors will be connected to your body. They will be used to check your heart, blood pressure, and oxygen level.  A breathing tube may be placed into your lungs during the procedure.  A thin, flexible tube (catheter) will be placed into your bladder to drain urine.  A tube may be inserted through your nose and into your stomach (nasogastric tube, or NG tube). The tube is used to remove stomach fluids after surgery until the intestines start working again.  An incision will be made in  your abdomen.  Clamps or staples will be put on your colon.  The part of the colon between the clamps or staples will be removed.  The ends of the colon that remain will be stitched or stapled together.  The incision in your abdomen will be closed with stitches (sutures) or staples.  The incision will be covered with a bandage (dressing).  A small opening (stoma) may be created in your lower abdomen. A removable, external pouch (ostomy pouch) will be attached to the stoma. This pouch will collect stool outside of your body. Stool passes through the stoma and into the pouch instead of through your anus. The procedure may vary among health care providers and hospitals. What happens after the procedure?  Your blood pressure, heart rate, breathing rate, and blood oxygen level will be monitored until the medicines you were given have worn off.  You may continue to receive fluids  and medicines through an IV tube.  You will start on a clear liquid diet and gradually go back to a normal diet.  Do not drive until your health care provider approves.  You may have some pain in your abdomen. You will be given pain medicine to control the pain.  You will be encouraged to do the following: ? Do breathing exercises to prevent pneumonia. ? Get up and start walking within a day after surgery. You should try to get up 5-6 times a day. This information is not intended to replace advice given to you by your health care provider. Make sure you discuss any questions you have with your health care provider. Document Revised: 04/20/2019 Document Reviewed: 07/26/2016 Elsevier Patient Education  [place] Colectomy, Care After This sheet gives you information about how to care for yourself after your procedure. Your health care provider may also give you more specific instructions. If you have problems or questions, contact your health care provider. What can I expect after the  procedure? After the procedure, it is common to have:  Pain in your abdomen, especially along your incision.  Tiredness. Your energy level will return to normal over the next several weeks.  Constipation.  Nausea.  Difficulty urinating. Follow these instructions at home: Activity  You may be able to return to most of your normal activities within 1-2 weeks, such as working, walking up stairs, and sexual activity.  Avoid activities that require a lot of energy for 4-6 weeks after surgery, such as running, climbing, and lifting heavy objects. Ask your health care provider what activities are safe for you.  Take rest breaks during the day as needed.  Do not drive for 1-2 weeks or until your health care provider says that it is safe.  Do not drive or use heavy machinery while taking prescription pain medicines.  Do not lift anything that is heavier than 10 lb (4.3 kg) until your health care provider says that it is safe. Incision care   Follow instructions from your health care provider about how to take care of your incision. Make sure you: ? Wash your hands with soap and water before you change your bandage (dressing). If soap and water are not available, use hand sanitizer. ? Change your dressing as told by your health care provider. ? Leave stitches (sutures) or staples in place. These skin closures may need to stay in place for 2 weeks or longer.  Avoid wearing tight clothing around your incision.  Protect your incision area from the sun.  Check your incision area every day for signs of infection. Check for: ? More redness, swelling, or pain. ? More fluid or blood. ? Warmth. ? Pus or a bad smell. General instructions  Do not take baths, swim, or use a hot tub until your health care provider approves. Ask your health care provider when you may shower.  Take over-the-counter and prescription medicines, including stool softeners, only as told by your health care  provider.  Eat a low-fat and low-fiber diet for the first 4 weeks after surgery.  Keep all follow-up visits as told by your health care provider. This is important. Contact a health care provider if:  You have more redness, swelling, or pain around your incision.  You have more fluid or blood coming from your incision.  Your incision feels warm to the touch.  You have pus or a bad smell coming from your incision.  You have a fever  or chills.  You do not have a bowel movement 2-3 days after surgery.  You cannot eat or drink for 24 hours or more.  You have persistent nausea and vomiting.  You have abdominal pain that gets worse and does not get better with medicine. Get help right away if:  You have chest pain.  You have shortness of breath.  You have pain or swelling in your legs.  Your incision breaks open after your sutures or staples have been removed.  You have bleeding from the rectum. This information is not intended to replace advice given to you by your health care provider. Make sure you discuss any questions you have with your health care provider. Document Revised: 10/07/2017 Document Reviewed: 07/26/2016 Elsevier Patient Education  2020 [place] Anesthesia, Adult, Care After This sheet gives you information about how to care for yourself after your procedure. Your health care provider may also give you more specific instructions. If you have problems or questions, contact your health care provider. What can I expect after the procedure? After the procedure, the following side effects are common:  Pain or discomfort at the IV site.  Nausea.  Vomiting.  Sore throat.  Trouble concentrating.  Feeling cold or chills.  Weak or tired.  Sleepiness and fatigue.  Soreness and body aches. These side effects can affect parts of the body that were not involved in surgery. Follow these instructions at home:  For at least 24 hours after the  procedure:  Have a responsible adult stay with you. It is important to have someone help care for you until you are awake and alert.  Rest as needed.  Do not: ? Participate in activities in which you could fall or become injured. ? Drive. ? Use heavy machinery. ? Drink alcohol. ? Take sleeping pills or medicines that cause drowsiness. ? Make important decisions or sign legal documents. ? Take care of children on your own. Eating and drinking  Follow any instructions from your health care provider about eating or drinking restrictions.  When you feel hungry, start by eating small amounts of foods that are soft and easy to digest (bland), such as toast. Gradually return to your regular diet.  Drink enough fluid to keep your urine pale yellow.  If you vomit, rehydrate by drinking water, juice, or clear broth. General instructions  If you have sleep apnea, surgery and certain medicines can increase your risk for breathing problems. Follow instructions from your health care provider about wearing your sleep device: ? Anytime you are sleeping, including during daytime naps. ? While taking prescription pain medicines, sleeping medicines, or medicines that make you drowsy.  Return to your normal activities as told by your health care provider. Ask your health care provider what activities are safe for you.  Take over-the-counter and prescription medicines only as told by your health care provider.  If you smoke, do not smoke without supervision.  Keep all follow-up visits as told by your health care provider. This is important. Contact a health care provider if:  You have nausea or vomiting that does not get better with medicine.  You cannot eat or drink without vomiting.  You have pain that does not get better with medicine.  You are unable to [place] urine.  You develop a skin rash.  You have a fever.  You have redness around your IV site that gets worse. Get help right away  if:  You have difficulty breathing.  You have  chest pain.  You have blood in your urine or stool, or you vomit blood. Summary  After the procedure, it is common to have a sore throat or nausea. It is also common to feel tired.  Have a responsible adult stay with you for the first 24 hours after general anesthesia. It is important to have someone help care for you until you are awake and alert.  When you feel hungry, start by eating small amounts of foods that are soft and easy to digest (bland), such as toast. Gradually return to your regular diet.  Drink enough fluid to keep your urine pale yellow.  Return to your normal activities as told by your health care provider. Ask your health care provider what activities are safe for you. This information is not intended to replace advice given to you by your health care provider. Make sure you discuss any questions you have with your health care provider. Document Revised: 10/28/2017 Document Reviewed: 06/10/2017 Elsevier Patient Education  [place].

## 2019-12-06 NOTE — H&P (Signed)
Timothy Brennan; [id number]; 09/11/1946   HPI Patient is a 74 year old black male who was referred to my care by Drs. Corum and Rourk for evaluation treatment of a dysplastic polyp seen on screening colonoscopy.  Patient underwent colonoscopy and was found to have dysplastic polyp at the splenic flexure along with a hard base which could not be removed endoscopically.  Final pathology revealed highly dysplastic lesion in this area with the inability to rule out cancer.  Patient denies any blood per rectum.  This was just a screening colonoscopy.  He has 0 out of 10 abdominal pain. Past Medical History:  Diagnosis Date  . Diabetes mellitus   . Hyperlipidemia   . Hypertension     Past Surgical History:  Procedure Laterality Date  . CARDIAC CATHETERIZATION     with stent placement  . COLONOSCOPY N/A 11/28/2019   Procedure: COLONOSCOPY;  Surgeon: Daneil Dolin, MD;  Location: [facility];  Service: Endoscopy;  Laterality: N/A;  1:00  . INGUINAL HERNIA REPAIR  11/15/2011   Procedure: HERNIA REPAIR INGUINAL ADULT;  Surgeon: Jamesetta So;  Location: [facility];  Service: General;  Laterality: Left;  . MOUTH SURGERY    . POLYPECTOMY  11/28/2019   Procedure: POLYPECTOMY;  Surgeon: Daneil Dolin, MD;  Location: [facility];  Service: Endoscopy;;  . ROTATOR CUFF REPAIR     left    Family History  Problem Relation Age of Onset  . Cancer Mother   . Cancer Father   . Anesthesia problems Neg Hx   . Hypotension Neg Hx   . Malignant hyperthermia Neg Hx   . Pseudochol deficiency Neg Hx   . Colon cancer Neg Hx     Current Outpatient Medications on File Prior to Visit  Medication Sig Dispense Refill  . amLODipine (NORVASC) 10 MG tablet Take 10 mg by mouth daily.      Marland Kitchen aspirin 325 MG tablet Take 325 mg by mouth daily.    Marland Kitchen atorvastatin (LIPITOR) 10 MG tablet Take 10 mg by mouth daily.    . hydrochlorothiazide (,MICROZIDE/HYDRODIURIL,) 12.5 MG capsule TAKE 1 CAPSULE EVERY MORNING (Patient taking  differently: Take 12.5 mg by mouth daily. ) 30 capsule 6  . quinapril (ACCUPRIL) 20 MG tablet Take 20 mg by mouth daily.      . sitaGLIPtan-metformin (JANUMET) 50-500 MG per tablet Take 1 tablet by mouth 2 (two) times daily with a meal.      No current facility-administered medications on file prior to visit.    Allergies  Allergen Reactions  . Penicillins Hives and Nausea And Vomiting    Did it involve swelling of the face/tongue/throat, SOB, or low BP? No Did it involve sudden or severe rash/hives, skin peeling, or any reaction on the inside of your mouth or nose? No Did you need to seek medical attention at a hospital or doctor's office? No When did it last happen? If all above answers are "NO", may proceed with cephalosporin use.     Social History   Substance and Sexual Activity  Alcohol Use No    Social History   Tobacco Use  Smoking Status Former Smoker  Smokeless Tobacco Never Used    Review of Systems  Constitutional: Negative.   HENT: Negative.   Eyes: Negative.   Respiratory: Negative.   Cardiovascular: Negative.   Gastrointestinal: Negative.   Genitourinary: Negative.   Musculoskeletal: Negative.   Skin: Negative.   Neurological: Negative.   Endo/Heme/Allergies: Negative.   Psychiatric/Behavioral:  Negative.     Objective   Vitals:   12/06/19 1103  BP: (!) 155/67  Pulse: 63  Resp: 16  Temp: 97.9 F (36.6 C)  SpO2: 98%    Physical Exam Vitals reviewed.  Constitutional:      Appearance: Normal appearance. He is normal weight. He is not ill-appearing.  HENT:     Head: Normocephalic and atraumatic.  Cardiovascular:     Rate and Rhythm: Normal rate and regular rhythm.     Heart sounds: Normal heart sounds. No murmur. No friction rub. No gallop.   Pulmonary:     Effort: Pulmonary effort is normal. No respiratory distress.     Breath sounds: Normal breath sounds. No stridor. No wheezing, rhonchi or rales.  Abdominal:     General:  Abdomen is flat. Bowel sounds are normal. There is no distension.     Palpations: Abdomen is soft. There is no mass.     Tenderness: There is no abdominal tenderness. There is no guarding or rebound.     Hernia: No hernia is present.  Skin:    General: Skin is warm and dry.  Neurological:     Mental Status: He is alert and oriented to person, place, and time.   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  The risks and benefits of the procedure including bleeding, infection, the possible need for blood transfusion, and the possibility of an anastomotic leak were fully explained to the patient, who gave informed consent.  Neomycin, Flagyl, and GoLYTELY preoperative bowel prep has been prescribed.  Patient will continue holding aspirin.

## 2019-12-06 NOTE — Telephone Encounter (Signed)
Called pt back with report of Not Detected COVID test. Voicemail left.

## 2019-12-06 NOTE — Patient Instructions (Signed)
Open Colectomy °An open colectomy is surgery to remove part or all of the large intestine (colon). This procedure may be used to treat several conditions, including: °· Inflammation and infection of the colon (diverticulitis). °· Tumors or masses in the colon. °· Inflammatory bowel disease, such as Crohn disease or ulcerative colitis. °· Bleeding from the colon. °· Blockage or obstruction of the colon. °Tell a health care provider about: °· Any allergies you have. °· All medicines you are taking, including vitamins, herbs, eye drops, creams, and over-the-counter medicines. °· Any problems you or family members have had with anesthetic medicines. °· Any blood disorders you have. °· Any surgeries you have had. °· Any medical conditions you have. °· Whether you are pregnant or may be pregnant. °· Whether you smoke or use tobacco products. These can affect your body's reaction to anesthesia. °What are the risks? °Generally, this is a safe procedure. However, problems may occur, including: °· Infection. °· Bleeding. °· Allergic reactions to medicines. °· Damage to other structures or organs. °· Pneumonia. °· The incision opening up. °· Tissues from inside the abdomen bulging through the incision (hernia). °· Reopening of the colon where it was stitched or stapled together. °· A blood clot forming in a vein and traveling to the lungs. °· Future blockage of the small intestine from scar tissue. °What happens before the procedure? °·  °Bowel prep °In some cases, you may be prescribed an oral bowel prep to clean out your colon. If so: °· Take it as told by your health care provider. Starting the day before your procedure, you may need to drink a large amount of medicated liquid. The liquid will cause you to have multiple loose stools until your stool is almost clear or light green. °· Follow instructions from your health care provider about eating and drinking restrictions during bowel prep. °Medicines °· Ask your health  care provider about: °? Changing or stopping your regular medicines or vitamins. This is especially important if you are taking diabetes medicines, blood thinners, or vitamin E. °? Taking medicines such as aspirin and ibuprofen. These medicines can thin your blood. Do not take these medicines before your procedure if your health care provider instructs you not to. °· If you were prescribed an antibiotic medicine, take it as told by your health care provider. °General instructions °· Bring loose-fitting, comfortable clothing and slip-on shoes that you can put on without bending over. °· Make sure to see your health care provider for any tests that you need before the procedure, such as: °? Blood tests. °? A test to check the heart's rhythm (electrocardiogram, ECG). °? A CT scan of your abdomen. °? Urine tests. °? Colonoscopy. °· Plan to have someone take you home from the hospital or clinic. °· Arrange for someone to help you with your activities during your recovery. °What happens during the procedure? °· To reduce your risk of infection: °? Your health care team will wash or sanitize their hands. °? Your skin will be washed with soap. °? Hair may be removed from the surgical area. °· An IV tube will be inserted into one of your veins. The tube will be used to give you medicines and fluids. °· You will be given a medicine to make you fall asleep (general anesthetic). You may also be given a medicine to help you relax (sedative). °· Small monitors will be connected to your body. They will be used to check your heart, blood pressure, and oxygen level. °·   A breathing tube may be placed into your lungs during the procedure. °· A thin, flexible tube (catheter) will be placed into your bladder to drain urine. °· A tube may be inserted through your nose and into your stomach (nasogastric tube, or NG tube). The tube is used to remove stomach fluids after surgery until the intestines start working again. °· An incision will  be made in your abdomen. °· Clamps or staples will be put on your colon. °· The part of the colon between the clamps or staples will be removed. °· The ends of the colon that remain will be stitched or stapled together. °· The incision in your abdomen will be closed with stitches (sutures) or staples. °· The incision will be covered with a bandage (dressing). °· A small opening (stoma) may be created in your lower abdomen. A removable, external pouch (ostomy pouch) will be attached to the stoma. This pouch will collect stool outside of your body. Stool passes through the stoma and into the pouch instead of through your anus. °The procedure may vary among health care providers and hospitals. °What happens after the procedure? °· Your blood pressure, heart rate, breathing rate, and blood oxygen level will be monitored until the medicines you were given have worn off. °· You may continue to receive fluids and medicines through an IV tube. °· You will start on a clear liquid diet and gradually go back to a normal diet. °· Do not drive until your health care provider approves. °· You may have some pain in your abdomen. You will be given pain medicine to control the pain. °· You will be encouraged to do the following: °? Do breathing exercises to prevent pneumonia. °? Get up and start walking within a day after surgery. You should try to get up 5-6 times a day. °This information is not intended to replace advice given to you by your health care provider. Make sure you discuss any questions you have with your health care provider. °Document Revised: 04/20/2019 Document Reviewed: 07/26/2016 °Elsevier Patient Education © 2020 [employer] ° °

## 2019-12-10 ENCOUNTER — Other Ambulatory Visit (HOSPITAL_COMMUNITY)
Admission: RE | Admit: 2019-12-10 | Discharge: 2019-12-10 | Disposition: A | Discharge status: Home / Self Care | Payer: Medicare HMO | Source: Ambulatory Visit | Attending: General Surgery | Admitting: General Surgery

## 2019-12-10 ENCOUNTER — Encounter (HOSPITAL_COMMUNITY)
Admission: RE | Admit: 2019-12-10 | Discharge: 2019-12-10 | Disposition: A | Discharge status: Home / Self Care | Payer: Medicare HMO | Source: Ambulatory Visit | Attending: General Surgery | Admitting: General Surgery

## 2019-12-10 ENCOUNTER — Other Ambulatory Visit: Payer: Self-pay

## 2019-12-10 ENCOUNTER — Encounter (HOSPITAL_COMMUNITY): Payer: Self-pay

## 2019-12-10 ENCOUNTER — Ambulatory Visit (HOSPITAL_COMMUNITY)
Admission: RE | Admit: 2019-12-10 | Discharge: 2019-12-10 | Disposition: A | Discharge status: Home / Self Care | Payer: Medicare HMO | Source: Ambulatory Visit | Attending: General Surgery | Admitting: General Surgery

## 2019-12-10 DIAGNOSIS — Z01818 Encounter for other preprocedural examination: Secondary | ICD-10-CM | POA: Insufficient documentation

## 2019-12-10 HISTORY — DX: Atherosclerotic heart disease of native coronary artery without angina pectoris: I25.10

## 2019-12-10 LAB — TYPE AND SCREEN
ABO/RH(D): O POS
Antibody Screen: NEGATIVE

## 2019-12-10 LAB — COMPREHENSIVE METABOLIC PANEL
ALT: 15 U/L (ref 0–44)
AST: 22 U/L (ref 15–41)
Albumin: 4.3 g/dL (ref 3.5–5.0)
Alkaline Phosphatase: 82 U/L (ref 38–126)
Anion gap: 11 (ref 5–15)
BUN: 16 mg/dL (ref 8–23)
CO2: 25 mmol/L (ref 22–32)
Calcium: 9.3 mg/dL (ref 8.9–10.3)
Chloride: 104 mmol/L (ref 98–111)
Creatinine, Ser: 1.11 mg/dL (ref 0.61–1.24)
GFR calc Af Amer: >60 mL/min (ref >60)
GFR calc non Af Amer: >60 mL/min (ref >60)
Glucose, Bld: 88 mg/dL (ref 70–99)
Potassium: 3.8 mmol/L (ref 3.5–5.1)
Sodium: 140 mmol/L (ref 135–145)
Total Bilirubin: 0.9 mg/dL (ref 0.3–1.2)
Total Protein: 7.3 g/dL (ref 6.5–8.1)

## 2019-12-10 LAB — CBC WITH DIFFERENTIAL/PLATELET
Abs Immature Granulocytes: 0.02 10*3/uL (ref 0.00–0.07)
Basophils Absolute: 0 10*3/uL (ref 0.0–0.1)
Basophils Relative: 1 %
Eosinophils Absolute: 0.1 10*3/uL (ref 0.0–0.5)
Eosinophils Relative: 1 %
HCT: 42.3 % (ref 39.0–52.0)
Hemoglobin: 14.4 g/dL (ref 13.0–17.0)
Immature Granulocytes: 0 %
Lymphocytes Relative: 21 %
Lymphs Abs: 1.4 10*3/uL (ref 0.7–4.0)
MCH: 29.2 pg (ref 26.0–34.0)
MCHC: 34 g/dL (ref 30.0–36.0)
MCV: 85.8 fL (ref 80.0–100.0)
Monocytes Absolute: 0.5 10*3/uL (ref 0.1–1.0)
Monocytes Relative: 9 %
Neutro Abs: 4.4 10*3/uL (ref 1.7–7.7)
Neutrophils Relative %: 68 %
Platelets: 222 10*3/uL (ref 150–400)
RBC: 4.93 MIL/uL (ref 4.22–5.81)
RDW: 14.3 % (ref 11.5–15.5)
WBC: 6.4 10*3/uL (ref 4.0–10.5)
nRBC: 0 % (ref 0.0–0.2)

## 2019-12-10 LAB — HEMOGLOBIN A1C
Hgb A1c MFr Bld: 6.2 % — ABNORMAL HIGH (ref 4.8–5.6)
Mean Plasma Glucose: 131.24 mg/dL

## 2019-12-10 LAB — GLUCOSE, CAPILLARY: Glucose-Capillary: 100 mg/dL — ABNORMAL HIGH (ref 70–99)

## 2019-12-10 LAB — SARS CORONAVIRUS 2 (TAT 6-24 HRS): SARS Coronavirus 2: NEGATIVE

## 2019-12-11 LAB — CEA: CEA: 1.9 ng/mL (ref 0.0–4.7)

## 2019-12-12 ENCOUNTER — Inpatient Hospital Stay (HOSPITAL_COMMUNITY)
Admission: RE | Admit: 2019-12-12 | Discharge: 2019-12-17 | DRG: 331 | Disposition: A | Discharge status: Home / Self Care | Payer: Medicare HMO | Attending: General Surgery | Admitting: General Surgery

## 2019-12-12 ENCOUNTER — Other Ambulatory Visit: Payer: Self-pay

## 2019-12-12 ENCOUNTER — Encounter (HOSPITAL_COMMUNITY): Payer: Self-pay | Admitting: General Surgery

## 2019-12-12 ENCOUNTER — Inpatient Hospital Stay (HOSPITAL_COMMUNITY): Payer: Medicare HMO | Admitting: Anesthesiology

## 2019-12-12 ENCOUNTER — Encounter (HOSPITAL_COMMUNITY)
Admission: RE | Disposition: A | Discharge status: Home / Self Care | Payer: Self-pay | Source: Home / Self Care | Attending: General Surgery

## 2019-12-12 DIAGNOSIS — Z880 Allergy status to penicillin: Secondary | ICD-10-CM

## 2019-12-12 DIAGNOSIS — Z809 Family history of malignant neoplasm, unspecified: Secondary | ICD-10-CM | POA: Diagnosis not present

## 2019-12-12 DIAGNOSIS — Z20822 Contact with and (suspected) exposure to covid-19: Secondary | ICD-10-CM | POA: Diagnosis present

## 2019-12-12 DIAGNOSIS — Z87891 Personal history of nicotine dependence: Secondary | ICD-10-CM | POA: Diagnosis not present

## 2019-12-12 DIAGNOSIS — E785 Hyperlipidemia, unspecified: Secondary | ICD-10-CM | POA: Diagnosis present

## 2019-12-12 DIAGNOSIS — E119 Type 2 diabetes mellitus without complications: Secondary | ICD-10-CM | POA: Diagnosis present

## 2019-12-12 DIAGNOSIS — K635 Polyp of colon: Principal | ICD-10-CM

## 2019-12-12 DIAGNOSIS — Z7982 Long term (current) use of aspirin: Secondary | ICD-10-CM | POA: Diagnosis not present

## 2019-12-12 DIAGNOSIS — I10 Essential (primary) hypertension: Secondary | ICD-10-CM | POA: Diagnosis present

## 2019-12-12 DIAGNOSIS — Z7984 Long term (current) use of oral hypoglycemic drugs: Secondary | ICD-10-CM | POA: Diagnosis not present

## 2019-12-12 DIAGNOSIS — Z9049 Acquired absence of other specified parts of digestive tract: Secondary | ICD-10-CM

## 2019-12-12 HISTORY — PX: PARTIAL COLECTOMY: SHX5273

## 2019-12-12 LAB — GLUCOSE, CAPILLARY
Glucose-Capillary: 116 mg/dL — ABNORMAL HIGH (ref 70–99)
Glucose-Capillary: 147 mg/dL — ABNORMAL HIGH (ref 70–99)
Glucose-Capillary: 137 mg/dL — ABNORMAL HIGH (ref 70–99)
Glucose-Capillary: 121 mg/dL — ABNORMAL HIGH (ref 70–99)

## 2019-12-12 SURGERY — COLECTOMY, PARTIAL
Anesthesia: General | Site: Abdomen

## 2019-12-12 MED ORDER — CHLORHEXIDINE GLUCONATE CLOTH 2 % EX PADS: 6.0000 | MEDICATED_PAD | Freq: Once | CUTANEOUS | Status: DC

## 2019-12-12 MED ORDER — ONDANSETRON HCL 4 MG/2ML IJ SOLN
INTRAMUSCULAR | Status: AC
  Administered 2019-12-12: 4 mg
  Filled 2019-12-12: qty 2

## 2019-12-12 MED ORDER — QUINAPRIL HCL 10 MG PO TABS: 20.0000 mg | ORAL_TABLET | Freq: Every day | ORAL | Status: DC

## 2019-12-12 MED ORDER — SUGAMMADEX SODIUM 200 MG/2ML IV SOLN
INTRAVENOUS | Status: DC | PRN
  Administered 2019-12-12: 200 mg via INTRAVENOUS

## 2019-12-12 MED ORDER — SODIUM CHLORIDE 0.9 % IV SOLN: INTRAVENOUS | Status: DC

## 2019-12-12 MED ORDER — ENOXAPARIN SODIUM 40 MG/0.4ML ~~LOC~~ SOLN
40.0000 mg | Freq: Once | SUBCUTANEOUS | Status: AC
  Administered 2019-12-12: 40 mg via SUBCUTANEOUS
  Filled 2019-12-12: qty 0.4

## 2019-12-12 MED ORDER — POVIDONE-IODINE 10 % OINT PACKET
TOPICAL_OINTMENT | CUTANEOUS | Status: DC | PRN
  Administered 2019-12-12: 1 via TOPICAL

## 2019-12-12 MED ORDER — FENTANYL CITRATE (PF) 250 MCG/5ML IJ SOLN
INTRAMUSCULAR | Status: AC
  Filled 2019-12-12: qty 5

## 2019-12-12 MED ORDER — 0.9 % SODIUM CHLORIDE (POUR BTL) OPTIME
TOPICAL | Status: DC | PRN
  Administered 2019-12-12: 2000 mL
  Administered 2019-12-12: 1000 mL

## 2019-12-12 MED ORDER — ROCURONIUM BROMIDE 10 MG/ML (PF) SYRINGE
PREFILLED_SYRINGE | INTRAVENOUS | Status: AC
  Filled 2019-12-12: qty 10

## 2019-12-12 MED ORDER — LIDOCAINE 2% (20 MG/ML) 5 ML SYRINGE
INTRAMUSCULAR | Status: AC
  Filled 2019-12-12: qty 5

## 2019-12-12 MED ORDER — ACETAMINOPHEN 650 MG RE SUPP: 650.0000 mg | Freq: Four times a day (QID) | RECTAL | Status: DC | PRN

## 2019-12-12 MED ORDER — BUPIVACAINE LIPOSOME 1.3 % IJ SUSP
INTRAMUSCULAR | Status: DC | PRN
  Administered 2019-12-12: 20 mL

## 2019-12-12 MED ORDER — AMLODIPINE BESYLATE 5 MG PO TABS
10.0000 mg | ORAL_TABLET | Freq: Every day | ORAL | Status: DC
  Administered 2019-12-13 – 2019-12-17 (×5): 10 mg via ORAL
  Filled 2019-12-12 (×6): qty 2

## 2019-12-12 MED ORDER — HYDROCODONE-ACETAMINOPHEN 7.5-325 MG PO TABS: 1.0000 | ORAL_TABLET | Freq: Once | ORAL | Status: DC | PRN

## 2019-12-12 MED ORDER — HYDROMORPHONE HCL 1 MG/ML IJ SOLN
0.2500 mg | INTRAMUSCULAR | Status: DC | PRN
  Administered 2019-12-12: 0.5 mg via INTRAVENOUS
  Filled 2019-12-12: qty 0.5

## 2019-12-12 MED ORDER — METRONIDAZOLE IN NACL 5-0.79 MG/ML-% IV SOLN
500.0000 mg | INTRAVENOUS | Status: AC
  Administered 2019-12-12: 500 mg via INTRAVENOUS
  Filled 2019-12-12: qty 100

## 2019-12-12 MED ORDER — PROPOFOL 10 MG/ML IV BOLUS
INTRAVENOUS | Status: AC
  Filled 2019-12-12: qty 20

## 2019-12-12 MED ORDER — ATORVASTATIN CALCIUM 10 MG PO TABS
10.0000 mg | ORAL_TABLET | Freq: Every day | ORAL | Status: DC
  Administered 2019-12-13 – 2019-12-17 (×5): 10 mg via ORAL
  Filled 2019-12-12 (×6): qty 1

## 2019-12-12 MED ORDER — PROPOFOL 10 MG/ML IV BOLUS
INTRAVENOUS | Status: DC | PRN
  Administered 2019-12-12: 160 mg via INTRAVENOUS

## 2019-12-12 MED ORDER — HYDROCODONE-ACETAMINOPHEN 5-325 MG PO TABS
1.0000 | ORAL_TABLET | ORAL | Status: DC | PRN
  Administered 2019-12-12: 2 via ORAL
  Administered 2019-12-13 – 2019-12-15 (×8): 1 via ORAL
  Administered 2019-12-16: 2 via ORAL
  Filled 2019-12-12: qty 2
  Filled 2019-12-12 (×4): qty 1
  Filled 2019-12-12: qty 2
  Filled 2019-12-12: qty 1
  Filled 2019-12-12: qty 2
  Filled 2019-12-12 (×2): qty 1

## 2019-12-12 MED ORDER — KETOROLAC TROMETHAMINE 15 MG/ML IJ SOLN: 15.0000 mg | Freq: Four times a day (QID) | INTRAMUSCULAR | Status: DC | PRN

## 2019-12-12 MED ORDER — LACTATED RINGERS IV SOLN: INTRAVENOUS | Status: DC

## 2019-12-12 MED ORDER — LIDOCAINE HCL (CARDIAC) PF 50 MG/5ML IV SOSY
PREFILLED_SYRINGE | INTRAVENOUS | Status: DC | PRN
  Administered 2019-12-12: 60 mg via INTRAVENOUS

## 2019-12-12 MED ORDER — ENOXAPARIN SODIUM 40 MG/0.4ML ~~LOC~~ SOLN
40.0000 mg | SUBCUTANEOUS | Status: DC
  Administered 2019-12-13 – 2019-12-17 (×5): 40 mg via SUBCUTANEOUS
  Filled 2019-12-12 (×5): qty 0.4

## 2019-12-12 MED ORDER — EPHEDRINE SULFATE 50 MG/ML IJ SOLN
INTRAMUSCULAR | Status: DC | PRN
  Administered 2019-12-12: 10 mg via INTRAVENOUS

## 2019-12-12 MED ORDER — ONDANSETRON HCL 4 MG/2ML IJ SOLN: 4.0000 mg | Freq: Four times a day (QID) | INTRAMUSCULAR | Status: DC | PRN

## 2019-12-12 MED ORDER — PROMETHAZINE HCL 25 MG/ML IJ SOLN: 6.2500 mg | INTRAMUSCULAR | Status: DC | PRN

## 2019-12-12 MED ORDER — INSULIN ASPART 100 UNIT/ML ~~LOC~~ SOLN
0.0000 [IU] | Freq: Three times a day (TID) | SUBCUTANEOUS | Status: DC
  Administered 2019-12-12 – 2019-12-14 (×3): 2 [IU] via SUBCUTANEOUS
  Administered 2019-12-15: 3 [IU] via SUBCUTANEOUS
  Administered 2019-12-16 (×3): 2 [IU] via SUBCUTANEOUS

## 2019-12-12 MED ORDER — SUCCINYLCHOLINE 20MG/ML (10ML) SYRINGE FOR MEDFUSION PUMP - OPTIME
INTRAMUSCULAR | Status: DC | PRN
  Administered 2019-12-12: 120 mg via INTRAVENOUS

## 2019-12-12 MED ORDER — ROCURONIUM 10MG/ML (10ML) SYRINGE FOR MEDFUSION PUMP - OPTIME
INTRAVENOUS | Status: DC | PRN
  Administered 2019-12-12: 5 mg via INTRAVENOUS
  Administered 2019-12-12: 30 mg via INTRAVENOUS

## 2019-12-12 MED ORDER — ALVIMOPAN 12 MG PO CAPS
12.0000 mg | ORAL_CAPSULE | ORAL | Status: AC
  Administered 2019-12-12: 12 mg via ORAL
  Filled 2019-12-12: qty 1

## 2019-12-12 MED ORDER — ONDANSETRON HCL 4 MG/2ML IJ SOLN
INTRAMUSCULAR | Status: AC
  Filled 2019-12-12: qty 2

## 2019-12-12 MED ORDER — LISINOPRIL 10 MG PO TABS
20.0000 mg | ORAL_TABLET | Freq: Every day | ORAL | Status: DC
  Administered 2019-12-13 – 2019-12-17 (×5): 20 mg via ORAL
  Filled 2019-12-12 (×6): qty 2

## 2019-12-12 MED ORDER — ALVIMOPAN 12 MG PO CAPS
12.0000 mg | ORAL_CAPSULE | Freq: Two times a day (BID) | ORAL | Status: DC
  Administered 2019-12-13 – 2019-12-17 (×9): 12 mg via ORAL
  Filled 2019-12-12 (×9): qty 1

## 2019-12-12 MED ORDER — BUPIVACAINE LIPOSOME 1.3 % IJ SUSP
INTRAMUSCULAR | Status: AC
  Filled 2019-12-12: qty 20

## 2019-12-12 MED ORDER — HYDROMORPHONE HCL 1 MG/ML IJ SOLN: 0.5000 mg | INTRAMUSCULAR | Status: DC | PRN

## 2019-12-12 MED ORDER — ONDANSETRON 4 MG PO TBDP: 4.0000 mg | ORAL_TABLET | Freq: Four times a day (QID) | ORAL | Status: DC | PRN

## 2019-12-12 MED ORDER — DIPHENHYDRAMINE HCL 50 MG/ML IJ SOLN
12.5000 mg | Freq: Four times a day (QID) | INTRAMUSCULAR | Status: DC | PRN
  Administered 2019-12-16: 12.5 mg via INTRAVENOUS
  Filled 2019-12-12: qty 1

## 2019-12-12 MED ORDER — HYDROCHLOROTHIAZIDE 12.5 MG PO CAPS
12.5000 mg | ORAL_CAPSULE | Freq: Every morning | ORAL | Status: DC
  Administered 2019-12-13 – 2019-12-17 (×5): 12.5 mg via ORAL
  Filled 2019-12-12 (×6): qty 1

## 2019-12-12 MED ORDER — ONDANSETRON HCL 4 MG/2ML IJ SOLN
INTRAMUSCULAR | Status: DC | PRN
  Administered 2019-12-12: 4 mg via INTRAVENOUS

## 2019-12-12 MED ORDER — ACETAMINOPHEN 325 MG PO TABS
650.0000 mg | ORAL_TABLET | Freq: Four times a day (QID) | ORAL | Status: DC | PRN
  Filled 2019-12-12: qty 2

## 2019-12-12 MED ORDER — SIMETHICONE 80 MG PO CHEW
40.0000 mg | CHEWABLE_TABLET | Freq: Four times a day (QID) | ORAL | Status: DC | PRN
  Administered 2019-12-15: 40 mg via ORAL
  Filled 2019-12-12 (×2): qty 1

## 2019-12-12 MED ORDER — KETOROLAC TROMETHAMINE 15 MG/ML IJ SOLN
15.0000 mg | Freq: Four times a day (QID) | INTRAMUSCULAR | Status: AC
  Administered 2019-12-12: 15 mg via INTRAVENOUS
  Filled 2019-12-12: qty 1

## 2019-12-12 MED ORDER — DIPHENHYDRAMINE HCL 12.5 MG/5ML PO ELIX: 12.5000 mg | ORAL_SOLUTION | Freq: Four times a day (QID) | ORAL | Status: DC | PRN

## 2019-12-12 MED ORDER — CIPROFLOXACIN IN D5W 400 MG/200ML IV SOLN
400.0000 mg | INTRAVENOUS | Status: AC
  Administered 2019-12-12: 11:00:00 400 mg via INTRAVENOUS
  Filled 2019-12-12: qty 200

## 2019-12-12 MED ORDER — MIDAZOLAM HCL 2 MG/2ML IJ SOLN: 0.5000 mg | Freq: Once | INTRAMUSCULAR | Status: DC | PRN

## 2019-12-12 MED ORDER — FENTANYL CITRATE (PF) 100 MCG/2ML IJ SOLN
INTRAMUSCULAR | Status: DC | PRN
  Administered 2019-12-12 (×5): 50 ug via INTRAVENOUS

## 2019-12-12 SURGICAL SUPPLY — 35 items
COVER LIGHT HANDLE STERIS (MISCELLANEOUS) ×8 IMPLANT
COVER WAND RF STERILE (DRAPES) ×2 IMPLANT
DRSG OPSITE POSTOP 4X8 (GAUZE/BANDAGES/DRESSINGS) ×4 IMPLANT
ELECT REM PT RETURN 9FT ADLT (ELECTROSURGICAL) ×2
ELECTRODE REM PT RTRN 9FT ADLT (ELECTROSURGICAL) ×1 IMPLANT
GLOVE BIO SURGEON STRL SZ 6.5 (GLOVE) ×4 IMPLANT
GLOVE BIO SURGEON STRL SZ7 (GLOVE) ×6 IMPLANT
GLOVE BIOGEL PI IND STRL 6.5 (GLOVE) ×2 IMPLANT
GLOVE BIOGEL PI IND STRL 7.0 (GLOVE) ×5 IMPLANT
GLOVE BIOGEL PI INDICATOR 6.5 (GLOVE) ×2
GLOVE BIOGEL PI INDICATOR 7.0 (GLOVE) ×5
GLOVE SURG SS PI 7.5 STRL IVOR (GLOVE) ×4 IMPLANT
GLOVE SURG SS PI 8.0 STRL IVOR (GLOVE) ×2 IMPLANT
GOWN STRL REUS W/TWL LRG LVL3 (GOWN DISPOSABLE) ×14 IMPLANT
INST SET MAJOR GENERAL (KITS) ×2 IMPLANT
KIT BLADEGUARD II DBL (SET/KITS/TRAYS/PACK) ×2 IMPLANT
KIT TURNOVER KIT A (KITS) ×2 IMPLANT
LIGASURE IMPACT 36 18CM CVD LR (INSTRUMENTS) ×2 IMPLANT
MANIFOLD NEPTUNE II (INSTRUMENTS) ×2 IMPLANT
NEEDLE HYPO 18GX1.5 BLUNT FILL (NEEDLE) ×2 IMPLANT
NS IRRIG 1000ML POUR BTL (IV SOLUTION) ×6 IMPLANT
PACK COLON (CUSTOM PROCEDURE TRAY) ×2 IMPLANT
PAD ARMBOARD 7.5X6 YLW CONV (MISCELLANEOUS) ×2 IMPLANT
PENCIL HANDSWITCHING (ELECTRODE) ×2 IMPLANT
RELOAD PROXIMATE 75MM BLUE (ENDOMECHANICALS) ×4 IMPLANT
RETRACTOR WND ALEXIS-O 25 LRG (MISCELLANEOUS) ×1 IMPLANT
RTRCTR WOUND ALEXIS O 25CM LRG (MISCELLANEOUS) ×2
SPONGE LAP 18X18 RF (DISPOSABLE) ×4 IMPLANT
STAPLER GUN LINEAR PROX 60 (STAPLE) ×2 IMPLANT
STAPLER PROXIMATE 75MM BLUE (STAPLE) ×2 IMPLANT
STAPLER VISISTAT (STAPLE) ×2 IMPLANT
SUT PDS AB 0 CTX 60 (SUTURE) ×4 IMPLANT
SUT SILK 3 0 SH CR/8 (SUTURE) ×4 IMPLANT
TRAY FOLEY MTR SLVR 16FR STAT (SET/KITS/TRAYS/PACK) ×2 IMPLANT
YANKAUER SUCT BULB TIP 10FT TU (MISCELLANEOUS) ×2 IMPLANT

## 2019-12-12 NOTE — Addendum Note (Signed)
Addendum  created 12/12/19 1241 by Ollen Bowl, CRNA   Charge Capture section accepted

## 2019-12-12 NOTE — Transfer of Care (Signed)
Immediate Anesthesia Transfer of Care Note  Patient: Timothy Brennan  Procedure(s) Performed: PARTIAL COLECTOMY (N/A Abdomen)  Patient Location: PACU  Anesthesia Type:General  Level of Consciousness: awake, alert , oriented and patient cooperative  Airway & Oxygen Therapy: Patient Spontanous Breathing and Patient connected to nasal cannula oxygen  Post-op Assessment: Report given to RN and Post -op Vital signs reviewed and stable  Post vital signs: Reviewed and stable  Last Vitals:  Vitals Value Taken Time  BP    Temp    Pulse 63 12/12/19 1233  Resp    SpO2 99 % 12/12/19 1233  Vitals shown include unvalidated device data.  Last Pain:  Vitals:   12/12/19 0923  TempSrc: Oral  PainSc: 0-No pain      Patients Stated Pain Goal: 5 ([id number])  Complications: No apparent anesthesia complications

## 2019-12-12 NOTE — Interval H&P Note (Signed)
History and Physical Interval Note:  12/12/2019 10:11 AM  Timothy Brennan  has presented today for surgery, with the diagnosis of DISPLASTIC POLYP OF COLON.  The various methods of treatment have been discussed with the patient and family. After consideration of risks, benefits and other options for treatment, the patient has consented to  Procedure(s): PARTIAL COLECTOMY (N/A) as a surgical intervention.  The patient's history has been reviewed, patient examined, no change in status, stable for surgery.  I have reviewed the patient's chart and labs.  Questions were answered to the patient's satisfaction.     Aviva Signs

## 2019-12-12 NOTE — Plan of Care (Signed)

## 2019-12-12 NOTE — Anesthesia Postprocedure Evaluation (Signed)
Anesthesia Post Note  Patient: Timothy Brennan  Procedure(s) Performed: PARTIAL COLECTOMY (N/A Abdomen)  Patient location during evaluation: PACU Anesthesia Type: General Level of consciousness: awake, awake and alert and patient cooperative Pain management: pain level controlled Vital Signs Assessment: post-procedure vital signs reviewed and stable Respiratory status: spontaneous breathing, nonlabored ventilation, respiratory function stable and patient connected to nasal cannula oxygen Cardiovascular status: stable Postop Assessment: no apparent nausea or vomiting Anesthetic complications: no     Last Vitals:  Vitals:   12/12/19 0923  BP: 132/62  Pulse: 60  Resp: 18  Temp: 36.8 C  SpO2: 99%    Last Pain:  Vitals:   12/12/19 0923  TempSrc: Oral  PainSc: 0-No pain                 Lind Ausley

## 2019-12-12 NOTE — Op Note (Signed)
Patient:  Timothy Brennan  DOB:  1946-01-06  MRN:  [id number]   Preop Diagnosis: Dysplastic polyp of colon  Postop Diagnosis: Same  Procedure: Partial colectomy  Surgeon: Aviva Signs, MD  Assistant: Curlene Labrum, MD  Anes: General endotracheal  Indications: Patient is a 74 year old black male who underwent a colonoscopy by Dr. Gala Romney and was found to have a dysplastic lesion and around the splenic flexure.  It was tattooed proximally distally.  The patient now comes to the operating room for a partial colectomy.  The risks and benefits of the procedure including bleeding, infection, cardiopulmonary difficulties, anastomotic leak, and the possibility of a blood transfusion were fully explained to the patient, who gave informed consent.  Procedure note: Patient was placed in the supine position.  After induction of general endotracheal anesthesia, the abdomen was prepped and draped using the usual sterile technique with ChloraPrep.  Surgical site confirmation was performed.  An upper midline incision was made to just below the umbilicus.  The peritoneal cavity was entered into without difficulty.  The liver was palpated and no abnormal lesions were noted.  The transverse colon was then exteriorized and it ended up that the tattoo marks were in the midportion of the transverse colon.  I was able to palpate the lesion that was identified by colonoscopy as it also had a hemostatic clip present.  A GIA 75 stapler was placed proximally and distally around the lesion past the points of tattooing.  The middle colic artery was identified and preserved in order to maintain the segments arterial blood flow.  Approximately 15 cm of colon was resected.  The mesentery was divided using the LigaSure.  A suture was placed proximally for orientation purposes.  The specimen was opened in the operating room to confirm the lesion within the specimen removed.  A side to side anastomosis was then performed using a  GIA-75 stapler.  The colotomy was closed using a TA 60 stapler.  The staple line was bolstered using 3-0 silk Lembert sutures.  Surrounding omentum was placed over the anastomosis to help protect it.  It was returned into the abdominal cavity in an orderly fashion.  The abdominal cavity was then copiously irrigated with normal saline.  All fluid was evacuated from the abdominal cavity.  All operating personnel then changed her gown and gloves.  A new set up was used for closure.  The fascia was reapproximated using a looped 0 PDS running suture.  The subcutaneous layer was irrigated with normal saline.  Exparel was instilled into the surrounding wound.  The skin was closed using staples.  Betadine ointment and dry sterile dressings were applied.  All tape and needle counts were correct at the end of the procedure.  The patient was extubated in the operating room and transferred to PACU in stable condition.  Complications: None  EBL: 50 cc  Specimen: Transverse colon, suture proximal

## 2019-12-12 NOTE — Anesthesia Procedure Notes (Signed)
Procedure Name: Intubation Date/Time: 12/12/2019 11:00 AM Performed by: Ollen Bowl, CRNA Pre-anesthesia Checklist: Patient identified, Patient being monitored, Timeout performed, Emergency Drugs available and Suction available Patient Re-evaluated:Patient Re-evaluated prior to induction Oxygen Delivery Method: Circle System Utilized Preoxygenation: Pre-oxygenation with 100% oxygen Induction Type: IV induction Ventilation: Mask ventilation without difficulty Laryngoscope Size: Mac and 3 Grade View: Grade II Tube type: Oral Tube size: 7.0 mm Number of attempts: 1 Airway Equipment and Method: stylet Placement Confirmation: ETT inserted through vocal cords under direct vision,  positive ETCO2 and breath sounds checked- equal and bilateral Secured at: 22 cm Tube secured with: Tape Dental Injury: Teeth and Oropharynx as per pre-operative assessment

## 2019-12-12 NOTE — Anesthesia Preprocedure Evaluation (Signed)
Anesthesia Evaluation  Patient identified by MRN, date of birth, ID band Patient awake    Reviewed: Allergy & Precautions, NPO status , Patient's Chart, lab work & pertinent test results  Airway Mallampati: II  TM Distance: >3 FB Neck ROM: Full    Dental no notable dental hx. (+) Edentulous Upper   Pulmonary neg pulmonary ROS, former smoker,    Pulmonary exam normal breath sounds clear to auscultation       Cardiovascular Exercise Tolerance: Good hypertension, Pt. on medications + CAD and + Cardiac Stents  Normal cardiovascular examI Rhythm:Regular Rate:Normal  Reports one stent placed over 10 years ago  Denies any CP/DOE Active  [place]olfs -states can walk miles  States never uses SL NTG   Neuro/Psych negative neurological ROS  negative psych ROS   GI/Hepatic negative GI ROS, Neg liver ROS,   Endo/Other  negative endocrine ROSdiabetes, Type 2, Oral Hypoglycemic Agents  Renal/GU negative Renal ROS  negative genitourinary   Musculoskeletal negative musculoskeletal ROS (+)   Abdominal   Peds negative pediatric ROS (+)  Hematology negative hematology ROS (+)   Anesthesia Other Findings   Reproductive/Obstetrics negative OB ROS                             Anesthesia Physical Anesthesia Plan  ASA: II  Anesthesia Plan: General   Post-op Pain Management:    Induction: Intravenous  PONV Risk Score and Plan: 2 and Ondansetron, Dexamethasone and Treatment may vary due to age or medical condition  Airway Management Planned: Oral ETT  Additional Equipment:   Intra-op Plan:   Post-operative Plan: Extubation in OR  Informed Consent: I have reviewed the patients History and Physical, chart, labs and discussed the procedure including the risks, benefits and alternatives for the proposed anesthesia with the patient or authorized representative who has indicated his/her understanding and  acceptance.     Dental advisory given  Plan Discussed with: CRNA  Anesthesia Plan Comments: (Plan Full PPE use Plan GETA D/W PT -WTP with same after Q&A)        Anesthesia Quick Evaluation

## 2019-12-13 LAB — CBC
HCT: 38.4 % — ABNORMAL LOW (ref 39.0–52.0)
Hemoglobin: 12.8 g/dL — ABNORMAL LOW (ref 13.0–17.0)
MCH: 28.4 pg (ref 26.0–34.0)
MCHC: 33.3 g/dL (ref 30.0–36.0)
MCV: 85.3 fL (ref 80.0–100.0)
Platelets: 181 10*3/uL (ref 150–400)
RBC: 4.5 MIL/uL (ref 4.22–5.81)
RDW: 14.1 % (ref 11.5–15.5)
WBC: 8.1 10*3/uL (ref 4.0–10.5)
nRBC: 0 % (ref 0.0–0.2)

## 2019-12-13 LAB — BASIC METABOLIC PANEL
Anion gap: 10 (ref 5–15)
BUN: 10 mg/dL (ref 8–23)
CO2: 24 mmol/L (ref 22–32)
Calcium: 8.5 mg/dL — ABNORMAL LOW (ref 8.9–10.3)
Chloride: 104 mmol/L (ref 98–111)
Creatinine, Ser: 0.99 mg/dL (ref 0.61–1.24)
GFR calc Af Amer: >60 mL/min (ref >60)
GFR calc non Af Amer: >60 mL/min (ref >60)
Glucose, Bld: 100 mg/dL — ABNORMAL HIGH (ref 70–99)
Potassium: 4 mmol/L (ref 3.5–5.1)
Sodium: 138 mmol/L (ref 135–145)

## 2019-12-13 LAB — GLUCOSE, CAPILLARY
Glucose-Capillary: 106 mg/dL — ABNORMAL HIGH (ref 70–99)
Glucose-Capillary: 139 mg/dL — ABNORMAL HIGH (ref 70–99)
Glucose-Capillary: 142 mg/dL — ABNORMAL HIGH (ref 70–99)

## 2019-12-13 LAB — MAGNESIUM: Magnesium: 1.8 mg/dL (ref 1.7–2.4)

## 2019-12-13 LAB — PHOSPHORUS: Phosphorus: 3.2 mg/dL (ref 2.5–4.6)

## 2019-12-13 MED ORDER — KCL IN DEXTROSE-NACL 20-5-0.45 MEQ/L-%-% IV SOLN: INTRAVENOUS | Status: DC

## 2019-12-13 NOTE — Progress Notes (Signed)
1 Day Post-Op  Subjective: Patient has minimal incisional pain.  No bowel movement or flatus yet.  No nausea or vomiting is noted.  Objective: Vital signs in last 24 hours: Temp:  [97.6 F (36.4 C)-98.3 F (36.8 C)] 97.7 F (36.5 C) (02/04 0434) Pulse Rate:  [57-74] 66 (02/04 0434) Resp:  [12-18] 16 (02/04 0434) BP: (124-161)/(48-69) 156/63 (02/04 0434) SpO2:  [95 %-100 %] 98 % (02/04 0434) Weight:  [80.3 kg] 80.3 kg (02/03 0923) Last BM Date: 12/12/19  Intake/Output from previous day: 02/03 0701 - 02/04 0700 In: 1390 [P.O.:240; I.V.:1150] Out: 925 [Urine:875; Blood:50] Intake/Output this shift: No intake/output data recorded.  General appearance: alert, cooperative and no distress Resp: clear to auscultation bilaterally Cardio: regular rate and rhythm, S1, S2 normal, no murmur, click, rub or gallop GI: Soft, dressing dry and intact with one spot of old blood present.  Betadine present.  Minimal bowel sounds appreciated.  Lab Results:  Recent Labs    12/10/19 0927 12/13/19 0603  WBC 6.4 8.1  HGB 14.4 12.8*  HCT 42.3 38.4*  PLT 222 181   BMET Recent Labs    12/10/19 0927 12/13/19 0603  NA 140 138  K 3.8 4.0  CL 104 104  CO2 25 24  GLUCOSE 88 100*  BUN 16 10  CREATININE 1.11 0.99  CALCIUM 9.3 8.5*   PT/INR No results for input(s): LABPROT, INR in the last 72 hours.  Studies/Results: No results found.  Anti-infectives: Anti-infectives (From admission, onward)   Start     Dose/Rate Route Frequency Ordered Stop   12/12/19 0845  metroNIDAZOLE (FLAGYL) IVPB 500 mg     500 mg 100 mL/hr over 60 Minutes Intravenous On call to O.R. 12/12/19 0835 12/12/19 1054   12/12/19 0845  ciprofloxacin (CIPRO) IVPB 400 mg     400 mg 200 mL/hr over 60 Minutes Intravenous On call to O.R. 12/12/19 0835 12/12/19 1122      Assessment/Plan: s/p Procedure(s): PARTIAL COLECTOMY Impression: Stable on postoperative day 1.  Will have his Foley removed today and will get patient  sitting up in chair.  Will advance diet once bowel function has returned.  LOS: 1 day    Aviva Signs 12/13/2019

## 2019-12-14 LAB — CBC
HCT: 39.3 % (ref 39.0–52.0)
Hemoglobin: 13 g/dL (ref 13.0–17.0)
MCH: 28.4 pg (ref 26.0–34.0)
MCHC: 33.1 g/dL (ref 30.0–36.0)
MCV: 86 fL (ref 80.0–100.0)
Platelets: 207 10*3/uL (ref 150–400)
RBC: 4.57 MIL/uL (ref 4.22–5.81)
RDW: 14.6 % (ref 11.5–15.5)
WBC: 9.1 10*3/uL (ref 4.0–10.5)
nRBC: 0 % (ref 0.0–0.2)

## 2019-12-14 LAB — GLUCOSE, CAPILLARY
Glucose-Capillary: 143 mg/dL — ABNORMAL HIGH (ref 70–99)
Glucose-Capillary: 132 mg/dL — ABNORMAL HIGH (ref 70–99)
Glucose-Capillary: 130 mg/dL — ABNORMAL HIGH (ref 70–99)
Glucose-Capillary: 138 mg/dL — ABNORMAL HIGH (ref 70–99)

## 2019-12-14 LAB — BASIC METABOLIC PANEL
Anion gap: 9 (ref 5–15)
BUN: 6 mg/dL — ABNORMAL LOW (ref 8–23)
CO2: 23 mmol/L (ref 22–32)
Calcium: 8.5 mg/dL — ABNORMAL LOW (ref 8.9–10.3)
Chloride: 104 mmol/L (ref 98–111)
Creatinine, Ser: 0.91 mg/dL (ref 0.61–1.24)
GFR calc Af Amer: >60 mL/min (ref >60)
GFR calc non Af Amer: >60 mL/min (ref >60)
Glucose, Bld: 158 mg/dL — ABNORMAL HIGH (ref 70–99)
Potassium: 3.9 mmol/L (ref 3.5–5.1)
Sodium: 136 mmol/L (ref 135–145)

## 2019-12-14 LAB — PHOSPHORUS: Phosphorus: 2.1 mg/dL — ABNORMAL LOW (ref 2.5–4.6)

## 2019-12-14 LAB — MAGNESIUM: Magnesium: 2.1 mg/dL (ref 1.7–2.4)

## 2019-12-14 LAB — SURGICAL PATHOLOGY

## 2019-12-14 MED ORDER — POTASSIUM PHOSPHATES 15 MMOLE/5ML IV SOLN
20.0000 mmol | Freq: Once | INTRAVENOUS | Status: AC
  Administered 2019-12-14: 14:00:00 20 mmol via INTRAVENOUS
  Filled 2019-12-14: qty 6.67

## 2019-12-14 NOTE — Progress Notes (Addendum)
2 Days Post-Op  Subjective: Patient has minimal incisional pain.  No flatus or bowel movement yet.  Denies any nausea or vomiting.  Tolerating clear liquid diet well.  Objective: Vital signs in last 24 hours: Temp:  [98.1 F (36.7 C)-99.1 F (37.3 C)] 98.1 F (36.7 C) (02/05 0700) Pulse Rate:  [66-74] 67 (02/05 0700) Resp:  [17-18] 18 (02/05 0700) BP: (127-140)/(55-67) 140/67 (02/05 0700) SpO2:  [94 %-100 %] 98 % (02/05 0700) Last BM Date: 12/12/19(prior to surgery)  Intake/Output from previous day: 02/04 0701 - 02/05 0700 In: 2310 [P.O.:840; I.V.:1470] Out: 1100 [Urine:1100] Intake/Output this shift: No intake/output data recorded.  General appearance: alert, cooperative and no distress Resp: clear to auscultation bilaterally Cardio: regular rate and rhythm, S1, S2 normal, no murmur, click, rub or gallop GI: Soft, flat.  Incision healing well.  Occasional bowel sounds appreciated.  Lab Results:  Recent Labs    12/13/19 0603 12/14/19 0632  WBC 8.1 9.1  HGB 12.8* 13.0  HCT 38.4* 39.3  PLT 181 207   BMET Recent Labs    12/13/19 0603 12/14/19 0632  NA 138 136  K 4.0 3.9  CL 104 104  CO2 24 23  GLUCOSE 100* 158*  BUN 10 6*  CREATININE 0.99 0.91  CALCIUM 8.5* 8.5*   PT/INR No results for input(s): LABPROT, INR in the last 72 hours.  Studies/Results: No results found.  Anti-infectives: Anti-infectives (From admission, onward)   Start     Dose/Rate Route Frequency Ordered Stop   12/12/19 0845  metroNIDAZOLE (FLAGYL) IVPB 500 mg     500 mg 100 mL/hr over 60 Minutes Intravenous On call to O.R. 12/12/19 0835 12/12/19 1054   12/12/19 0845  ciprofloxacin (CIPRO) IVPB 400 mg     400 mg 200 mL/hr over 60 Minutes Intravenous On call to O.R. 12/12/19 0835 12/12/19 1122      Assessment/Plan: s/p Procedure(s): PARTIAL COLECTOMY Impression: Stable on postoperative day 2.  Awaiting return of bowel function.  Hypophosphatemia noted and will be addressed.  Will  advance to full liquid diet. Final pathology reveals highly dysplastic lesion, but no malignancy seen.  Patient notified of results.  LOS: 2 days    Aviva Signs 12/14/2019

## 2019-12-14 NOTE — Progress Notes (Signed)
MEDICATION RELATED CONSULT NOTE - INITIAL   Pharmacy Consult for phosphorus Indication: hypophosphetemia   Allergies  Allergen Reactions  . Penicillins Hives and Nausea And Vomiting    Did it involve swelling of the face/tongue/throat, SOB, or low BP? No Did it involve sudden or severe rash/hives, skin peeling, or any reaction on the inside of your mouth or nose? No Did you need to seek medical attention at a hospital or doctor's office? No When did it last happen? If all above answers are "NO", may proceed with cephalosporin use.     Patient Measurements: Height: 5\' 8"  (172.7 cm) Weight: 177 lb 0.5 oz (80.3 kg) IBW/kg (Calculated) : 68.4   Vital Signs: Temp: 98.1 F (36.7 C) (02/05 0700) Temp Source: Oral (02/05 0700) BP: 140/67 (02/05 0700) Pulse Rate: 67 (02/05 0700) Intake/Output from previous day: 02/04 0701 - 02/05 0700 In: 2310 [P.O.:840; I.V.:1470] Out: 1100 [Urine:1100] Intake/Output from this shift: No intake/output data recorded.  Labs: Recent Labs    12/13/19 0603 12/14/19 0632  WBC 8.1 9.1  HGB 12.8* 13.0  HCT 38.4* 39.3  PLT 181 207  CREATININE 0.99 0.91  MG 1.8 2.1  PHOS 3.2 2.1*   Estimated Creatinine Clearance: 72 mL/min (by C-G formula based on SCr of 0.91 mg/dL).   Microbiology: Recent Results (from the past 720 hour(s))  SARS CORONAVIRUS 2 (TAT 6-24 HRS) Nasopharyngeal Nasopharyngeal Swab     Status: None   Collection Time: 11/26/19  7:03 AM   Specimen: Nasopharyngeal Swab  Result Value Ref Range Status   SARS Coronavirus 2 NEGATIVE NEGATIVE Final    Comment: (NOTE) SARS-CoV-2 target nucleic acids are NOT DETECTED. The SARS-CoV-2 RNA is generally detectable in upper and lower respiratory specimens during the acute phase of infection. Negative results do not preclude SARS-CoV-2 infection, do not rule out co-infections with other pathogens, and should not be used as the sole basis for treatment or other patient management  decisions. Negative results must be combined with clinical observations, patient history, and epidemiological information. The expected result is Negative. Fact Sheet for Patients: SugarRoll.be Fact Sheet for Healthcare Providers: https://www.woods-mathews.com/ This test is not yet approved or cleared by the [place] FDA and  has been authorized for detection and/or diagnosis of SARS-CoV-2 by FDA under an Emergency Use Authorization (EUA). This EUA will remain  in effect (meaning this test can be used) for the duration of the COVID-19 declaration under Section 56 4(b)(1) of the Act, 21 U.S.C. section 360bbb-3(b)(1), unless the authorization is terminated or revoked sooner. Performed at [facility], [place] [address], [place], [place] [address]   Novel Coronavirus, NAA ([facility])     Status: None   Collection Time: 12/04/19  8:26 AM   Specimen: Nasopharyngeal(NP) swabs in vial transport medium   NASOPHARYNGE  TESTING  Result Value Ref Range Status   SARS-CoV-2, NAA Not Detected Not Detected Final    Comment: This nucleic acid amplification test was developed and its performance characteristics determined by [employer]. Nucleic acid amplification tests include RT-PCR and TMA. This test has not been FDA cleared or approved. This test has been authorized by FDA under an Emergency Use Authorization (EUA). This test is only authorized for the duration of time the declaration that circumstances exist justifying the authorization of the emergency use of in vitro diagnostic tests for detection of SARS-CoV-2 virus and/or diagnosis of COVID-19 infection under section 564(b)(1) of the Act, 21 U.S.C. [id number]) (1), unless the authorization is terminated or revoked  sooner. When diagnostic testing is negative, the possibility of a false negative result should be considered in the context of a patient's recent exposures and the presence  of clinical signs and symptoms consistent with COVID-19. An individual without symptoms of COVID-19 and who is not shedding SARS-CoV-2 virus wo uld expect to have a negative (not detected) result in this assay.   SARS CORONAVIRUS 2 (TAT 6-24 HRS) Nasopharyngeal Nasopharyngeal Swab     Status: None   Collection Time: 12/10/19  7:17 AM   Specimen: Nasopharyngeal Swab  Result Value Ref Range Status   SARS Coronavirus 2 NEGATIVE NEGATIVE Final    Comment: (NOTE) SARS-CoV-2 target nucleic acids are NOT DETECTED. The SARS-CoV-2 RNA is generally detectable in upper and lower respiratory specimens during the acute phase of infection. Negative results do not preclude SARS-CoV-2 infection, do not rule out co-infections with other pathogens, and should not be used as the sole basis for treatment or other patient management decisions. Negative results must be combined with clinical observations, patient history, and epidemiological information. The expected result is Negative. Fact Sheet for Patients: SugarRoll.be Fact Sheet for Healthcare Providers: https://www.woods-mathews.com/ This test is not yet approved or cleared by the [place] FDA and  has been authorized for detection and/or diagnosis of SARS-CoV-2 by FDA under an Emergency Use Authorization (EUA). This EUA will remain  in effect (meaning this test can be used) for the duration of the COVID-19 declaration under Section 56 4(b)(1) of the Act, 21 U.S.C. section 360bbb-3(b)(1), unless the authorization is terminated or revoked sooner. Performed at [facility], [place] [address] [place], [place] [address]     Medical History: Past Medical History:  Diagnosis Date  . Coronary artery disease   . Diabetes mellitus   . Hyperlipidemia   . Hypertension     Medications:  Medications Prior to Admission  Medication Sig Dispense Refill Last Dose  . amLODipine (NORVASC) 10 MG tablet  Take 10 mg by mouth daily.     12/12/2019 at 0445  . atorvastatin (LIPITOR) 10 MG tablet Take 10 mg by mouth daily.   12/11/2019 at Unknown time  . hydrochlorothiazide (,MICROZIDE/HYDRODIURIL,) 12.5 MG capsule TAKE 1 CAPSULE EVERY MORNING (Patient taking differently: Take 12.5 mg by mouth daily. ) 30 capsule 6 12/12/2019 at 0445  . metroNIDAZOLE (FLAGYL) 500 MG tablet Take 1 tablet (500 mg total) by mouth 3 (three) times daily. Take one tablet po at 1pm, 2pm, and 9pm the day before surgery 3 tablet 0 12/11/2019 at Unknown time  . neomycin (MYCIFRADIN) 500 MG tablet Take 2 tablets (1,000 mg total) by mouth 3 (three) times daily. Take two tablets po at 1pm, 2pm, and 9pm the day before surgery 6 tablet 0 12/11/2019 at Unknown time  . quinapril (ACCUPRIL) 20 MG tablet Take 20 mg by mouth daily.     12/12/2019 at 0445  . sitaGLIPtan-metformin (JANUMET) 50-500 MG per tablet Take 1 tablet by mouth 2 (two) times daily with a meal.    12/11/2019 at Unknown time  . aspirin 325 MG tablet Take 325 mg by mouth daily.   11/28/2019    Assessment: Patient with hypophosphatemia of 2.1  Goal of Therapy:  Replace phosphorus   Plan:  Potassium phosphate 20 mmol IV x 1 dose. Monitor level in AM.  Ramond Craver 12/14/2019,12:10 PM

## 2019-12-14 NOTE — Care Management Important Message (Signed)
Important Message  Patient Details  Name: Timothy Brennan MRN: [id number] Date of Birth: July 05, 1946   Medicare Important Message Given:  Yes     [name] 12/14/2019, 10:25 AM

## 2019-12-15 LAB — GLUCOSE, CAPILLARY
Glucose-Capillary: 112 mg/dL — ABNORMAL HIGH (ref 70–99)
Glucose-Capillary: 177 mg/dL — ABNORMAL HIGH (ref 70–99)
Glucose-Capillary: 100 mg/dL — ABNORMAL HIGH (ref 70–99)
Glucose-Capillary: 167 mg/dL — ABNORMAL HIGH (ref 70–99)

## 2019-12-15 LAB — BASIC METABOLIC PANEL
Anion gap: 9 (ref 5–15)
BUN: 5 mg/dL — ABNORMAL LOW (ref 8–23)
CO2: 24 mmol/L (ref 22–32)
Calcium: 8.5 mg/dL — ABNORMAL LOW (ref 8.9–10.3)
Chloride: 106 mmol/L (ref 98–111)
Creatinine, Ser: 0.93 mg/dL (ref 0.61–1.24)
GFR calc Af Amer: >60 mL/min (ref >60)
GFR calc non Af Amer: >60 mL/min (ref >60)
Glucose, Bld: 145 mg/dL — ABNORMAL HIGH (ref 70–99)
Potassium: 4 mmol/L (ref 3.5–5.1)
Sodium: 139 mmol/L (ref 135–145)

## 2019-12-15 LAB — CBC
HCT: 36 % — ABNORMAL LOW (ref 39.0–52.0)
Hemoglobin: 11.9 g/dL — ABNORMAL LOW (ref 13.0–17.0)
MCH: 28.7 pg (ref 26.0–34.0)
MCHC: 33.1 g/dL (ref 30.0–36.0)
MCV: 86.7 fL (ref 80.0–100.0)
Platelets: 187 10*3/uL (ref 150–400)
RBC: 4.15 MIL/uL — ABNORMAL LOW (ref 4.22–5.81)
RDW: 14.5 % (ref 11.5–15.5)
WBC: 6.7 10*3/uL (ref 4.0–10.5)
nRBC: 0 % (ref 0.0–0.2)

## 2019-12-15 LAB — MAGNESIUM: Magnesium: 2 mg/dL (ref 1.7–2.4)

## 2019-12-15 LAB — PHOSPHORUS: Phosphorus: 3.1 mg/dL (ref 2.5–4.6)

## 2019-12-15 NOTE — Progress Notes (Signed)
3 Days Post-Op  Subjective: Patient denies any significant incisional pain.  Tolerating full liquid diet well.  No bowel movement yet.  Objective: Vital signs in last 24 hours: Temp:  [97.8 F (36.6 C)-98.4 F (36.9 C)] 98.3 F (36.8 C) (02/06 0646) Pulse Rate:  [65-72] 65 (02/06 0646) Resp:  [18-20] 18 (02/06 0646) BP: (125-147)/(61-68) 125/61 (02/06 0646) SpO2:  [96 %-99 %] 96 % (02/06 0803) Last BM Date: 12/11/19  Intake/Output from previous day: 02/05 0701 - 02/06 0700 In: 1080 [P.O.:1080] Out: 1200 [Urine:1200] Intake/Output this shift: No intake/output data recorded.  General appearance: alert, cooperative and no distress Resp: clear to auscultation bilaterally Cardio: regular rate and rhythm, S1, S2 normal, no murmur, click, rub or gallop GI: Soft, incision healing well.  Bowel sounds present.  Lab Results:  Recent Labs    12/14/19 0632 12/15/19 0523  WBC 9.1 6.7  HGB 13.0 11.9*  HCT 39.3 36.0*  PLT 207 187   BMET Recent Labs    12/14/19 0632 12/15/19 0523  NA 136 139  K 3.9 4.0  CL 104 106  CO2 23 24  GLUCOSE 158* 145*  BUN 6* 5*  CREATININE 0.91 0.93  CALCIUM 8.5* 8.5*   PT/INR No results for input(s): LABPROT, INR in the last 72 hours.  Studies/Results: No results found.  Anti-infectives: Anti-infectives (From admission, onward)   Start     Dose/Rate Route Frequency Ordered Stop   12/12/19 0845  metroNIDAZOLE (FLAGYL) IVPB 500 mg     500 mg 100 mL/hr over 60 Minutes Intravenous On call to O.R. 12/12/19 0835 12/12/19 1054   12/12/19 0845  ciprofloxacin (CIPRO) IVPB 400 mg     400 mg 200 mL/hr over 60 Minutes Intravenous On call to O.R. 12/12/19 0835 12/12/19 1122      Assessment/Plan: s/p Procedure(s): PARTIAL COLECTOMY Impression: Stable on postoperative day 3.  Awaiting return of bowel function.  Hypophosphatemia resolved.  Will advance to regular diet once bowel function has returned.  LOS: 3 days    Aviva Signs 12/15/2019

## 2019-12-16 LAB — GLUCOSE, CAPILLARY
Glucose-Capillary: 128 mg/dL — ABNORMAL HIGH (ref 70–99)
Glucose-Capillary: 140 mg/dL — ABNORMAL HIGH (ref 70–99)
Glucose-Capillary: 122 mg/dL — ABNORMAL HIGH (ref 70–99)
Glucose-Capillary: 121 mg/dL — ABNORMAL HIGH (ref 70–99)

## 2019-12-16 NOTE — Progress Notes (Signed)
Patient had a small bowel movement that was red and stringy and resembled blood. Patient did not complain of pain of or discomfort.

## 2019-12-16 NOTE — Progress Notes (Signed)
4 Days Post-Op  Subjective: Patient has not had a bowel movement yet, but he is tolerating full liquid diet well.  No incisional pain is noted.  Objective: Vital signs in last 24 hours: Temp:  [97.8 F (36.6 C)-98.1 F (36.7 C)] 97.8 F (36.6 C) (02/07 0500) Pulse Rate:  [61-77] 61 (02/07 0500) Resp:  [16-17] 17 (02/07 0500) BP: (121-130)/(56-58) 122/56 (02/07 0500) SpO2:  [96 %-100 %] 96 % (02/07 1020) Last BM Date: 12/11/19  Intake/Output from previous day: 02/06 0701 - 02/07 0700 In: 960 [P.O.:960] Out: -  Intake/Output this shift: Total I/O In: 240 [P.O.:240] Out: -   General appearance: alert, cooperative and no distress Resp: clear to auscultation bilaterally Cardio: regular rate and rhythm, S1, S2 normal, no murmur, click, rub or gallop GI: Soft, incision healing well.  Occasional bowel sounds appreciated.  Lab Results:  Recent Labs    12/14/19 0632 12/15/19 0523  WBC 9.1 6.7  HGB 13.0 11.9*  HCT 39.3 36.0*  PLT 207 187   BMET Recent Labs    12/14/19 0632 12/15/19 0523  NA 136 139  K 3.9 4.0  CL 104 106  CO2 23 24  GLUCOSE 158* 145*  BUN 6* 5*  CREATININE 0.91 0.93  CALCIUM 8.5* 8.5*   PT/INR No results for input(s): LABPROT, INR in the last 72 hours.  Studies/Results: No results found.  Anti-infectives: Anti-infectives (From admission, onward)   Start     Dose/Rate Route Frequency Ordered Stop   12/12/19 0845  metroNIDAZOLE (FLAGYL) IVPB 500 mg     500 mg 100 mL/hr over 60 Minutes Intravenous On call to O.R. 12/12/19 0835 12/12/19 1054   12/12/19 0845  ciprofloxacin (CIPRO) IVPB 400 mg     400 mg 200 mL/hr over 60 Minutes Intravenous On call to O.R. 12/12/19 0835 12/12/19 1122      Assessment/Plan: s/p Procedure(s): PARTIAL COLECTOMY Impression: Stable on postoperative day 4.  Awaiting full return of bowel function.  Will advance to carb modified diet.  Will get patient ambulating.  Anticipate discharge in next 24 to 48 hours.  LOS:  4 days    Aviva Signs 12/16/2019

## 2019-12-17 LAB — GLUCOSE, CAPILLARY
Glucose-Capillary: 117 mg/dL — ABNORMAL HIGH (ref 70–99)
Glucose-Capillary: 130 mg/dL — ABNORMAL HIGH (ref 70–99)
Glucose-Capillary: 15 mg/dL — CL (ref 70–99)

## 2019-12-17 NOTE — Progress Notes (Signed)
Pt ambulated up and down unit hallway. Pt's O2 is 99% and HR is 60. Pt tolerated walking well without complications. Will continue to monitor pt.

## 2019-12-17 NOTE — Discharge Summary (Signed)
Physician Discharge Summary  Patient ID: Timothy Brennan MRN: [id number] DOB/AGE: [birth year] 74 y.o.  Admit date: 12/12/2019 Discharge date: 12/17/2019  Admission Diagnoses: Dysplastic polyp of colon  Discharge Diagnoses: Same Active Problems:   Dysplastic polyp of colon   S/P partial colectomy Non-insulin-dependent diabetes mellitus, hypertension  Discharged Condition: good  Hospital Course: Patient is a 74 year old black male who was referred to my care for a partial colectomy due to severe dysplasia of a colon polyp seen on colonoscopy.  It cannot be fully removed at the time of colonoscopy.  He underwent a partial colectomy on 12/12/2019.  He tolerated the surgery well.  His postoperative course was for the most part unremarkable.  His diet was advanced out difficulty once his bowel function returned.  His diabetes was managed with a sliding scale insulin.  Final pathology revealed the lesion to be with severe dysplasia, but no evidence of malignancy.  Patient is aware of those results.  The patient is being discharged home on 12/17/2019 in good and improving condition.  Treatments: surgery: Partial colectomy on 12/12/2019  Discharge Exam: Blood pressure 126/63, pulse 63, temperature 98.2 F (36.8 C), resp. rate 17, height 5\' 8"  (1.727 m), weight 80.3 kg, SpO2 97 %. General appearance: alert, cooperative and no distress Resp: clear to auscultation bilaterally Cardio: regular rate and rhythm, S1, S2 normal, no murmur, click, rub or gallop GI: Soft, incision healing well.  Bowel sounds present.  Disposition: Discharge disposition: 01-Home or Self Care       Discharge Instructions    Diet - low sodium heart healthy   Complete by: As directed    Increase activity slowly   Complete by: As directed      Allergies as of 12/17/2019      Reactions   Penicillins Hives, Nausea And Vomiting   Did it involve swelling of the face/tongue/throat, SOB, or low BP? No Did it involve sudden or  severe rash/hives, skin peeling, or any reaction on the inside of your mouth or nose? No Did you need to seek medical attention at a hospital or doctor's office? No When did it last happen? If all above answers are "NO", may proceed with cephalosporin use.      Medication List    STOP taking these medications   metroNIDAZOLE 500 MG tablet Commonly known as: Flagyl   neomycin 500 MG tablet Commonly known as: MYCIFRADIN     TAKE these medications   amLODipine 10 MG tablet Commonly known as: NORVASC Take 10 mg by mouth daily.   aspirin 325 MG tablet Take 325 mg by mouth daily.   atorvastatin 10 MG tablet Commonly known as: LIPITOR Take 10 mg by mouth daily.   hydrochlorothiazide 12.5 MG capsule Commonly known as: MICROZIDE TAKE 1 CAPSULE EVERY MORNING What changed: when to take this   quinapril 20 MG tablet Commonly known as: ACCUPRIL Take 20 mg by mouth daily.   sitaGLIPtin-metformin 50-500 MG tablet Commonly known as: JANUMET Take 1 tablet by mouth 2 (two) times daily with a meal.      Follow-up Information    Aviva Signs, MD. Schedule an appointment as soon as possible for a visit on 12/25/2019.   Specialty: General Surgery Contact information: [id number] [id number] [phone]           Signed: Aviva Signs 12/17/2019, 8:48 AM

## 2019-12-17 NOTE — Progress Notes (Signed)
Patient with no complaints at this time.  Discharge instructions reviewed with patient at this time. Patient given opportunity to voice concerns/ask questions. IV removed per policy and band-aid applied to site. Pt is waiting for his wife to come pick him up after 1230 today when she is off work. Pt with not complaints at present time.

## 2019-12-17 NOTE — Discharge Instructions (Signed)
Open Colectomy, Care After This sheet gives you information about how to care for yourself after your procedure. Your health care provider may also give you more specific instructions. If you have problems or questions, contact your health care provider. What can I expect after the procedure? After the procedure, it is common to have:  Pain in your abdomen, especially along your incision.  Tiredness. Your energy level will return to normal over the next several weeks.  Constipation.  Nausea.  Difficulty urinating. Follow these instructions at home: Activity  You may be able to return to most of your normal activities within 1-2 weeks, such as working, walking up stairs, and sexual activity.  Avoid activities that require a lot of energy for 4-6 weeks after surgery, such as running, climbing, and lifting heavy objects. Ask your health care provider what activities are safe for you.  Take rest breaks during the day as needed.  Do not drive for 1-2 weeks or until your health care provider says that it is safe.  Do not drive or use heavy machinery while taking prescription pain medicines.  Do not lift anything that is heavier than 10 lb (4.3 kg) until your health care provider says that it is safe. Incision care   Follow instructions from your health care provider about how to take care of your incision. Make sure you: ? Wash your hands with soap and water before you change your bandage (dressing). If soap and water are not available, use hand sanitizer. ? Change your dressing as told by your health care provider. ? Leave stitches (sutures) or staples in place. These skin closures may need to stay in place for 2 weeks or longer.  Avoid wearing tight clothing around your incision.  Protect your incision area from the sun.  Check your incision area every day for signs of infection. Check for: ? More redness, swelling, or pain. ? More fluid or blood. ? Warmth. ? Pus or a bad  smell. General instructions  Do not take baths, swim, or use a hot tub until your health care provider approves. Ask your health care provider when you may shower.  Take over-the-counter and prescription medicines, including stool softeners, only as told by your health care provider.  Eat a low-fat and low-fiber diet for the first 4 weeks after surgery.  Keep all follow-up visits as told by your health care provider. This is important. Contact a health care provider if:  You have more redness, swelling, or pain around your incision.  You have more fluid or blood coming from your incision.  Your incision feels warm to the touch.  You have pus or a bad smell coming from your incision.  You have a fever or chills.  You do not have a bowel movement 2-3 days after surgery.  You cannot eat or drink for 24 hours or more.  You have persistent nausea and vomiting.  You have abdominal pain that gets worse and does not get better with medicine. Get help right away if:  You have chest pain.  You have shortness of breath.  You have pain or swelling in your legs.  Your incision breaks open after your sutures or staples have been removed.  You have bleeding from the rectum. This information is not intended to replace advice given to you by your health care provider. Make sure you discuss any questions you have with your health care provider. Document Revised: 10/07/2017 Document Reviewed: 07/26/2016 Elsevier Patient Education  2020 Elsevier   Inc. ° °

## 2019-12-17 NOTE — Progress Notes (Signed)
Pt sitting up in bed talking to medical student when RN is rounding. He is alert and oriented and shows no visible signs of distress. Denies any pain or discomfort. Only question is "when am I going home?". Call bell is within reach.

## 2019-12-18 LAB — GLUCOSE, CAPILLARY: Glucose-Capillary: 15 mg/dL — CL (ref 70–99)

## 2020-01-01 ENCOUNTER — Ambulatory Visit: Payer: Self-pay | Admitting: General Surgery

## 2020-01-01 ENCOUNTER — Encounter: Payer: Self-pay | Admitting: General Surgery

## 2020-01-01 ENCOUNTER — Ambulatory Visit (INDEPENDENT_AMBULATORY_CARE_PROVIDER_SITE_OTHER): Payer: Self-pay | Admitting: General Surgery

## 2020-01-01 ENCOUNTER — Other Ambulatory Visit: Payer: Self-pay

## 2020-01-01 VITALS — BP 150/70 | HR 68 | Temp 97.4°F | Resp 14 | Ht 68.5 in | Wt 169.2 lb

## 2020-01-01 DIAGNOSIS — Z09 Encounter for follow-up examination after completed treatment for conditions other than malignant neoplasm: Secondary | ICD-10-CM

## 2020-01-01 NOTE — Progress Notes (Signed)
Subjective:     Timothy Brennan  Here for postoperative visit.  Patient states he is doing very well.  His appetite is normal and he is having normal bowel movements.  He denies any fever or chills. Objective:    BP (!) 150/70   Pulse 68   Temp (!) 97.4 F (36.3 C) (Oral)   Resp 14   Ht 5' 8.5" (1.74 m)   Wt 169 lb 3.2 oz (76.7 kg)   SpO2 97%   BMI 25.35 kg/m   General:  alert, cooperative and no distress  Abdomen is soft, incision healing well.  Staples removed, Steri-Strips applied. Final pathology shows Foley removed polyp with high-grade dysplasia but no evidence of malignancy.  Lymph nodes are negative.  Patient aware.     Assessment:    Doing well postoperatively.    Plan:   As no malignancy was seen, no need for referral to oncology.  Patient was instructed to follow-up with Dr. Gala Romney for follow-up colonoscopy as predetermined.  Follow-up here as needed.

## 2020-01-23 ENCOUNTER — Other Ambulatory Visit: Payer: Self-pay | Admitting: Family Medicine

## 2020-01-23 NOTE — Telephone Encounter (Signed)
Please advise on this refill  

## 2020-02-04 ENCOUNTER — Other Ambulatory Visit: Payer: Self-pay

## 2020-02-04 ENCOUNTER — Ambulatory Visit (INDEPENDENT_AMBULATORY_CARE_PROVIDER_SITE_OTHER): Payer: Medicare HMO | Admitting: Family Medicine

## 2020-02-04 ENCOUNTER — Encounter: Payer: Self-pay | Admitting: Family Medicine

## 2020-02-04 VITALS — BP 140/63 | HR 73 | Temp 97.8°F | Ht 68.0 in | Wt 174.6 lb

## 2020-02-04 DIAGNOSIS — E119 Type 2 diabetes mellitus without complications: Secondary | ICD-10-CM | POA: Diagnosis not present

## 2020-02-04 DIAGNOSIS — I10 Essential (primary) hypertension: Secondary | ICD-10-CM | POA: Diagnosis not present

## 2020-02-04 DIAGNOSIS — E785 Hyperlipidemia, unspecified: Secondary | ICD-10-CM

## 2020-02-04 NOTE — Progress Notes (Signed)
Established Patient Office Visit  Subjective:  Patient ID: Timothy Brennan, male    DOB: 01-02-1946  Age: 74 y.o. MRN: [id number]  CC:  Chief Complaint  Patient presents with  . Follow-up    6 month f/u on diabetes. last A1c was done in the hospital on 12/10/19 resulted as 6.2    HPI Timothy Brennan presents for DM/HTN/Hyperlipidemia-resection of colon after abnormal polyp found on colonoscopy. ED-taking Cialis medication-given by urology-exam 11/20  Past Medical History:  Diagnosis Date  . Coronary artery disease   . Diabetes mellitus   . Hyperlipidemia   . Hypertension     Past Surgical History:  Procedure Laterality Date  . CARDIAC CATHETERIZATION  2007   with stent placement  . COLONOSCOPY N/A 11/28/2019   Procedure: COLONOSCOPY;  Surgeon: Daneil Dolin, MD;  Location: [facility];  Service: Endoscopy;  Laterality: N/A;  1:00  . INGUINAL HERNIA REPAIR  11/15/2011   Procedure: HERNIA REPAIR INGUINAL ADULT;  Surgeon: Jamesetta So;  Location: [facility];  Service: General;  Laterality: Left;  . MOUTH SURGERY    . PARTIAL COLECTOMY N/A 12/12/2019   Procedure: PARTIAL COLECTOMY;  Surgeon: Aviva Signs, MD;  Location: [facility];  Service: General;  Laterality: N/A;  . POLYPECTOMY  11/28/2019   Procedure: POLYPECTOMY;  Surgeon: Daneil Dolin, MD;  Location: [facility];  Service: Endoscopy;;  . ROTATOR CUFF REPAIR     left    Family History  Problem Relation Age of Onset  . Cancer Mother   . Cancer Father   . Anesthesia problems Neg Hx   . Hypotension Neg Hx   . Malignant hyperthermia Neg Hx   . Pseudochol deficiency Neg Hx   . Colon cancer Neg Hx     Social History   Socioeconomic History  . Marital status: Married    Spouse name: Not on file  . Number of children: Not on file  . Years of education: Not on file  . Highest education level: Not on file  Occupational History  . Not on file  Tobacco Use  . Smoking status: Former Smoker    Quit date: 07/1969   Years since quitting: 50.6  . Smokeless tobacco: Never Used  Substance and Sexual Activity  . Alcohol use: No  . Drug use: No  . Sexual activity: Yes  Other Topics Concern  . Not on file  Social History Narrative  . Not on file   Social Determinants of Health   Financial Resource Strain:   . Difficulty of Paying Living Expenses:   Food Insecurity:   . Worried About Charity fundraiser in the Last Year:   . Arboriculturist in the Last Year:   Transportation Needs:   . Film/video editor (Medical):   Marland Kitchen Lack of Transportation (Non-Medical):   Physical Activity:   . Days of Exercise per Week:   . Minutes of Exercise per Session:   Stress:   . Feeling of Stress :   Social Connections:   . Frequency of Communication with Friends and Family:   . Frequency of Social Gatherings with Friends and Family:   . Attends Religious Services:   . Active Member of Clubs or Organizations:   . Attends Archivist Meetings:   Marland Kitchen Marital Status:   Intimate Partner Violence:   . Fear of Current or Ex-Partner:   . Emotionally Abused:   Marland Kitchen Physically Abused:   . Sexually Abused:  Outpatient Medications Prior to Visit  Medication Sig Dispense Refill  . amLODipine (NORVASC) 10 MG tablet Take 10 mg by mouth daily.      Marland Kitchen aspirin 325 MG tablet Take 325 mg by mouth daily.    Marland Kitchen atorvastatin (LIPITOR) 10 MG tablet Take 10 mg by mouth daily.    . hydrochlorothiazide (,MICROZIDE/HYDRODIURIL,) 12.5 MG capsule TAKE 1 CAPSULE EVERY MORNING (Patient taking differently: Take 12.5 mg by mouth daily. ) 30 capsule 6  . quinapril (ACCUPRIL) 20 MG tablet Take 20 mg by mouth daily.      . sitaGLIPtan-metformin (JANUMET) 50-500 MG per tablet Take 1 tablet by mouth 2 (two) times daily with a meal.      No facility-administered medications prior to visit.    Allergies  Allergen Reactions  . Penicillins Hives and Nausea And Vomiting    Did it involve swelling of the face/tongue/throat, SOB, or low  BP? No Did it involve sudden or severe rash/hives, skin peeling, or any reaction on the inside of your mouth or nose? No Did you need to seek medical attention at a hospital or doctor's office? No When did it last happen? If all above answers are "NO", may proceed with cephalosporin use.     ROS Review of Systems  Constitutional: Negative.   HENT: Negative.   Eyes: Negative.   Respiratory: Negative.   Cardiovascular: Negative.   Gastrointestinal: Negative.        2/21Colon polyp with resection of area surrounding biopsy COLON, TRANSVERE, RESECTION:  - Tubular adenoma with focal high-grade dysplasia  - Margins uninvolved by high-grade dysplasia  - Three benign lymph nodes (0/3)  - Previous biopsy site changes   Endocrine:       DM  Genitourinary:       ED-seen by urology  Allergic/Immunologic: Negative.   Neurological: Negative.       Objective:    Physical Exam  Constitutional: He is oriented to person, place, and time. He appears well-developed and well-nourished.  HENT:  Head: Normocephalic and atraumatic.  Eyes: Conjunctivae are normal.  Cardiovascular: Normal rate, regular rhythm, normal heart sounds and intact distal pulses.  Pulmonary/Chest: Breath sounds normal.  Neurological: He is oriented to person, place, and time.  Psychiatric: He has a normal mood and affect. His behavior is normal.    BP 140/63 (BP Location: Left Arm, Patient Position: Sitting, Cuff Size: Normal)   Pulse 73   Temp 97.8 F (36.6 C) (Temporal)   Ht 5\' 8"  (1.727 m)   Wt 174 lb 9.6 oz (79.2 kg)   SpO2 97%   BMI 26.55 kg/m  Wt Readings from Last 3 Encounters:  02/04/20 174 lb 9.6 oz (79.2 kg)  01/01/20 169 lb 3.2 oz (76.7 kg)  12/12/19 177 lb 0.5 oz (80.3 kg)     Health Maintenance Due  Topic Date Due  . Hepatitis C Screening  Never done  . OPHTHALMOLOGY EXAM  Never done  . TETANUS/TDAP  Never done  . INFLUENZA VACCINE  Never done  . PNA vac Low Risk Adult (2 of 2 -  PPSV23) 08/09/2019    Lab Results  Component Value Date   WBC 6.7 12/15/2019   HGB 11.9 (L) 12/15/2019   HCT 36.0 (L) 12/15/2019   MCV 86.7 12/15/2019   PLT 187 12/15/2019   Lab Results  Component Value Date   NA 139 12/15/2019   K 4.0 12/15/2019   CO2 24 12/15/2019   GLUCOSE 145 (H) 12/15/2019   BUN  5 (L) 12/15/2019   CREATININE 0.93 12/15/2019   BILITOT 0.9 12/10/2019   ALKPHOS 82 12/10/2019   AST 22 12/10/2019   ALT 15 12/10/2019   PROT 7.3 12/10/2019   ALBUMIN 4.3 12/10/2019   CALCIUM 8.5 (L) 12/15/2019   ANIONGAP 9 12/15/2019    Lab Results  Component Value Date   HGBA1C 6.2 (H) 12/10/2019      Assessment & Plan:  1. Diabetes mellitus without complication ([place]) 123XX123 6.2% 2/21  2. Essential hypertension HCTZ/Accupril-stable-Cr normal  3. Hyperlipidemia, unspecified hyperlipidemia type lipitor-lipid panel with next blood drawn-lft normal 2/21   Follow-up:  6 months-DM well controlled  Bob Daversa Hannah Beat, MD

## 2020-02-04 NOTE — Patient Instructions (Addendum)
Fasting labwork-2021   If you have lab work done today you will be contacted with your lab results within the next 2 weeks.  If you have not heard from [place] then please contact us. The fastest way to get your results is to register for My Chart.   IF you received an x-ray today, you will receive an invoice from [facility]. Please contact [facility] at [phone] with questions or concerns regarding your invoice.   IF you received labwork today, you will receive an invoice from [facility]. Please contact [facility] at [phone] with questions or concerns regarding your invoice.   Our billing staff will not be able to assist you with questions regarding bills from these companies.  You will be contacted with the lab results as soon as they are available. The fastest way to get your results is to activate your My Chart account. Instructions are located on the last page of this paperwork. If you have not heard from [place] regarding the results in 2 weeks, please contact this office.      Diabetes Mellitus and Nutrition, Adult When you have diabetes (diabetes mellitus), it is very important to have healthy eating habits because your blood sugar (glucose) levels are greatly affected by what you eat and drink. Eating healthy foods in the appropriate amounts, at about the same times every day, can help you:  Control your blood glucose.  Lower your risk of heart disease.  Improve your blood pressure.  Reach or maintain a healthy weight. Every person with diabetes is different, and each person has different needs for a meal plan. Your health care provider may recommend that you work with a diet and nutrition specialist (dietitian) to make a meal plan that is best for you. Your meal plan may vary depending on factors such as:  The calories you need.  The medicines you take.  Your weight.  Your blood glucose, blood pressure, and cholesterol levels.  Your activity  level.  Other health conditions you have, such as heart or kidney disease. How do carbohydrates affect me? Carbohydrates, also called carbs, affect your blood glucose level more than any other type of food. Eating carbs naturally raises the amount of glucose in your blood. Carb counting is a method for keeping track of how many carbs you eat. Counting carbs is important to keep your blood glucose at a healthy level, especially if you use insulin or take certain oral diabetes medicines. It is important to know how many carbs you can safely have in each meal. This is different for every person. Your dietitian can help you calculate how many carbs you should have at each meal and for each snack. Foods that contain carbs include:  Bread, cereal, rice, pasta, and crackers.  Potatoes and corn.  Peas, beans, and lentils.  Milk and yogurt.  Fruit and juice.  Desserts, such as cakes, cookies, ice cream, and candy. How does alcohol affect me? Alcohol can cause a sudden decrease in blood glucose (hypoglycemia), especially if you use insulin or take certain oral diabetes medicines. Hypoglycemia can be a life-threatening condition. Symptoms of hypoglycemia (sleepiness, dizziness, and confusion) are similar to symptoms of having too much alcohol. If your health care provider says that alcohol is safe for you, follow these guidelines:  Limit alcohol intake to no more than 1 drink per day for nonpregnant women and 2 drinks per day for men. One drink equals 12 oz of beer, 5 oz of wine, or 1 oz of  hard liquor.  Do not drink on an empty stomach.  Keep yourself hydrated with water, diet soda, or unsweetened iced tea.  Keep in mind that regular soda, juice, and other mixers may contain a lot of sugar and must be counted as carbs. What are tips for following this plan?  Reading food labels  Start by checking the serving size on the "Nutrition Facts" label of packaged foods and drinks. The amount of  calories, carbs, fats, and other nutrients listed on the label is based on one serving of the item. Many items contain more than one serving per package.  Check the total grams (g) of carbs in one serving. You can calculate the number of servings of carbs in one serving by dividing the total carbs by 15. For example, if a food has 30 g of total carbs, it would be equal to 2 servings of carbs.  Check the number of grams (g) of saturated and trans fats in one serving. Choose foods that have low or no amount of these fats.  Check the number of milligrams (mg) of salt (sodium) in one serving. Most people should limit total sodium intake to less than 2,300 mg per day.  Always check the nutrition information of foods labeled as "low-fat" or "nonfat". These foods may be higher in added sugar or refined carbs and should be avoided.  Talk to your dietitian to identify your daily goals for nutrients listed on the label. Shopping  Avoid buying canned, premade, or processed foods. These foods tend to be high in fat, sodium, and added sugar.  Shop around the outside edge of the grocery store. This includes fresh fruits and vegetables, bulk grains, fresh meats, and fresh dairy. Cooking  Use low-heat cooking methods, such as baking, instead of high-heat cooking methods like deep frying.  Cook using healthy oils, such as olive, canola, or sunflower oil.  Avoid cooking with butter, cream, or high-fat meats. Meal planning  Eat meals and snacks regularly, preferably at the same times every day. Avoid going long periods of time without eating.  Eat foods high in fiber, such as fresh fruits, vegetables, beans, and whole grains. Talk to your dietitian about how many servings of carbs you can eat at each meal.  Eat 4-6 ounces (oz) of lean protein each day, such as lean meat, chicken, fish, eggs, or tofu. One oz of lean protein is equal to: ? 1 oz of meat, chicken, or fish. ? 1 egg. ?  cup of tofu.  Eat  some foods each day that contain healthy fats, such as avocado, nuts, seeds, and fish. Lifestyle  Check your blood glucose regularly.  Exercise regularly as told by your health care provider. This may include: ? 150 minutes of moderate-intensity or vigorous-intensity exercise each week. This could be brisk walking, biking, or water aerobics. ? Stretching and doing strength exercises, such as yoga or weightlifting, at least 2 times a week.  Take medicines as told by your health care provider.  Do not use any products that contain nicotine or tobacco, such as cigarettes and e-cigarettes. If you need help quitting, ask your health care provider.  Work with a Social worker or diabetes educator to identify strategies to manage stress and any emotional and social challenges. Questions to ask a health care provider  Do I need to meet with a diabetes educator?  Do I need to meet with a dietitian?  What number can I call if I have questions?  When are the  best times to check my blood glucose? Where to find more information:  American Diabetes Association: diabetes.org  Academy of Nutrition and Dietetics: www.eatright.[employer] of Diabetes and Digestive and Kidney Diseases (NIH): [employer] Summary  A healthy meal plan will help you control your blood glucose and maintain a healthy lifestyle.  Working with a diet and nutrition specialist (dietitian) can help you make a meal plan that is best for you.  Keep in mind that carbohydrates (carbs) and alcohol have immediate effects on your blood glucose levels. It is important to count carbs and to use alcohol carefully. This information is not intended to replace advice given to you by your health care provider. Make sure you discuss any questions you have with your health care provider. Document Revised: 10/07/2017 Document Reviewed: 11/29/2016 Elsevier Patient Education  2020 [employer].

## 2020-08-01 ENCOUNTER — Other Ambulatory Visit (HOSPITAL_COMMUNITY): Payer: Self-pay | Admitting: Internal Medicine

## 2020-08-01 ENCOUNTER — Other Ambulatory Visit: Payer: Self-pay | Admitting: Internal Medicine

## 2020-08-01 DIAGNOSIS — I739 Peripheral vascular disease, unspecified: Secondary | ICD-10-CM

## 2020-08-04 ENCOUNTER — Other Ambulatory Visit: Payer: Self-pay

## 2020-08-04 ENCOUNTER — Ambulatory Visit (HOSPITAL_COMMUNITY)
Admission: RE | Admit: 2020-08-04 | Discharge: 2020-08-04 | Disposition: A | Discharge status: Home / Self Care | Payer: Medicare HMO | Source: Ambulatory Visit | Attending: Internal Medicine | Admitting: Internal Medicine

## 2020-08-04 DIAGNOSIS — I739 Peripheral vascular disease, unspecified: Secondary | ICD-10-CM

## 2020-12-11 ENCOUNTER — Encounter: Payer: Self-pay | Admitting: Internal Medicine

## 2021-03-31 ENCOUNTER — Ambulatory Visit: Payer: Medicare HMO | Admitting: General Surgery

## 2021-03-31 ENCOUNTER — Other Ambulatory Visit: Payer: Self-pay

## 2021-03-31 ENCOUNTER — Encounter: Payer: Self-pay | Admitting: General Surgery

## 2021-03-31 VITALS — BP 129/75 | HR 48 | Temp 98.3°F | Resp 18 | Ht 68.0 in | Wt 177.0 lb

## 2021-03-31 DIAGNOSIS — K4090 Unilateral inguinal hernia, without obstruction or gangrene, not specified as recurrent: Secondary | ICD-10-CM | POA: Diagnosis not present

## 2021-03-31 NOTE — Patient Instructions (Signed)
Inguinal Hernia, Adult An inguinal hernia develops when fat or the intestines push through a weak spot in a muscle where the leg meets the lower abdomen (groin). This creates a bulge. This kind of hernia could also be:  In the scrotum, if you are male.  In folds of skin around the vagina, if you are male. There are three types of inguinal hernias:  Hernias that can be pushed back into the abdomen (are reducible). This type rarely causes pain.  Hernias that are not reducible (are incarcerated).  Hernias that are not reducible and lose their blood supply (are strangulated). This type of hernia requires emergency surgery. What are the causes? This condition is caused by having a weak spot in the muscles or tissues in your groin. This develops over time. The hernia may poke through the weak spot when you suddenly strain your lower abdominal muscles, such as when you:  Lift a heavy object.  Strain to have a bowel movement. Constipation can lead to straining.  Cough. What increases the risk? This condition is more likely to develop in:  Males.  Pregnant females.  People who: ? Are overweight. ? Work in jobs that require long periods of standing or heavy lifting. ? Have had an inguinal hernia before. ? Smoke or have lung disease. These factors can lead to long-term (chronic) coughing. What are the signs or symptoms? Symptoms may depend on the size of the hernia. Often, a small inguinal hernia has no symptoms. Symptoms of a larger hernia may include:  A bulge in the groin area. This is easier to see when standing. It might not be visible when lying down.  Pain or burning in the groin. This may get worse when lifting, straining, or coughing.  A dull ache or a feeling of pressure in the groin.  An unusual bulge in the scrotum, in males. Symptoms of a strangulated inguinal hernia may include:  A bulge in your groin that is very painful and tender to the touch.  A bulge that  turns red or purple.  Fever, nausea, and vomiting.  Inability to have a bowel movement or to Groninger gas. How is this diagnosed? This condition is diagnosed based on your symptoms, your medical history, and a physical exam. Your health care provider may feel your groin area and ask you to cough. How is this treated? Treatment depends on the size of your hernia and whether you have symptoms. If you do not have symptoms, your health care provider may have you watch your hernia carefully and have you come in for follow-up visits. If your hernia is large or if you have symptoms, you may need surgery to repair the hernia. Follow these instructions at home: Lifestyle  Avoid lifting heavy objects.  Avoid standing for long periods of time.  Do not use any products that contain nicotine or tobacco. These products include cigarettes, chewing tobacco, and vaping devices, such as e-cigarettes. If you need help quitting, ask your health care provider.  Maintain a healthy weight. Preventing constipation You may need to take these actions to prevent or treat constipation:  Drink enough fluid to keep your urine pale yellow.  Take over-the-counter or prescription medicines.  Eat foods that are high in fiber, such as beans, whole grains, and fresh fruits and vegetables.  Limit foods that are high in fat and processed sugars, such as fried or sweet foods. General instructions  You may try to push the hernia back in place by very gently   pressing on it while lying down. Do not try to force the bulge back in if it will not push in easily.  Watch your hernia for any changes in shape, size, or color. Get help right away if you notice any changes.  Take over-the-counter and prescription medicines only as told by your health care provider.  Keep all follow-up visits. This is important. Contact a health care provider if:  You have a fever or chills.  You develop new symptoms.  Your symptoms get  worse. Get help right away if:  You have pain in your groin that suddenly gets worse.  You have a bulge in your groin that: ? Suddenly gets bigger and does not get smaller. ? Becomes red or purple or painful to the touch.  You are a man and you have a sudden pain in your scrotum, or the size of your scrotum suddenly changes.  You cannot push the hernia back in place by very gently pressing on it when you are lying down.  You have nausea or vomiting that does not go away.  You have a fast heartbeat.  You cannot have a bowel movement or Humphreys gas. These symptoms may represent a serious problem that is an emergency. Do not wait to see if the symptoms will go away. Get medical help right away. Call your local emergency services (911 in the [place] Summary  An inguinal hernia develops when fat or the intestines push through a weak spot in a muscle where your leg meets your lower abdomen (groin).  This condition is caused by having a weak spot in muscles or tissues in your groin.  Symptoms may depend on the size of the hernia, and they may include pain or swelling in your groin. A small inguinal hernia often has no symptoms.  Treatment may not be needed if you do not have symptoms. If you have symptoms or a large hernia, you may need surgery to repair the hernia.  Avoid lifting heavy objects. Also, avoid standing for long periods of time. This information is not intended to replace advice given to you by your health care provider. Make sure you discuss any questions you have with your health care provider. Document Revised: 06/24/2020 Document Reviewed: 06/24/2020 Elsevier Patient Education  2021 [employer] Inc.  

## 2021-03-31 NOTE — Progress Notes (Signed)
Timothy Brennan; [id number]; 1946-05-06   HPI Patient is a 75 year old black male who was referred to my care by Dr. Allyn Kenner for evaluation and treatment of a right inguinal hernia.  Patient states he developed the hernia recently and it is increasing in size and causing him discomfort.  It does reduce when lying down.  Is made worse with straining. Past Medical History:  Diagnosis Date  . Coronary artery disease   . Diabetes mellitus   . Hyperlipidemia   . Hypertension     Past Surgical History:  Procedure Laterality Date  . CARDIAC CATHETERIZATION  2007   with stent placement  . COLONOSCOPY N/A 11/28/2019   Procedure: COLONOSCOPY;  Surgeon: Daneil Dolin, MD;  Location: [facility];  Service: Endoscopy;  Laterality: N/A;  1:00  . INGUINAL HERNIA REPAIR  11/15/2011   Procedure: HERNIA REPAIR INGUINAL ADULT;  Surgeon: Jamesetta So;  Location: [facility];  Service: General;  Laterality: Left;  . MOUTH SURGERY    . PARTIAL COLECTOMY N/A 12/12/2019   Procedure: PARTIAL COLECTOMY;  Surgeon: Aviva Signs, MD;  Location: [facility];  Service: General;  Laterality: N/A;  . POLYPECTOMY  11/28/2019   Procedure: POLYPECTOMY;  Surgeon: Daneil Dolin, MD;  Location: [facility];  Service: Endoscopy;;  . ROTATOR CUFF REPAIR     left    Family History  Problem Relation Age of Onset  . Cancer Mother   . Cancer Father   . Anesthesia problems Neg Hx   . Hypotension Neg Hx   . Malignant hyperthermia Neg Hx   . Pseudochol deficiency Neg Hx   . Colon cancer Neg Hx     Current Outpatient Medications on File Prior to Visit  Medication Sig Dispense Refill  . amLODipine (NORVASC) 10 MG tablet Take 10 mg by mouth daily.    Marland Kitchen aspirin 325 MG tablet Take 325 mg by mouth daily.    Marland Kitchen atorvastatin (LIPITOR) 10 MG tablet Take 10 mg by mouth daily.    . hydrochlorothiazide (,MICROZIDE/HYDRODIURIL,) 12.5 MG capsule TAKE 1 CAPSULE EVERY MORNING (Patient taking differently: Take 12.5 mg by mouth daily.) 30  capsule 6  . quinapril (ACCUPRIL) 20 MG tablet Take 20 mg by mouth daily.    . sitaGLIPtan-metformin (JANUMET) 50-500 MG per tablet Take 1 tablet by mouth 2 (two) times daily with a meal.     No current facility-administered medications on file prior to visit.    Allergies  Allergen Reactions  . Penicillins Hives and Nausea And Vomiting    Did it involve swelling of the face/tongue/throat, SOB, or low BP? No Did it involve sudden or severe rash/hives, skin peeling, or any reaction on the inside of your mouth or nose? No Did you need to seek medical attention at a hospital or doctor's office? No When did it last happen? If all above answers are "NO", may proceed with cephalosporin use.     Social History   Substance and Sexual Activity  Alcohol Use No    Social History   Tobacco Use  Smoking Status Former Smoker  . Quit date: 07/1969  . Years since quitting: 51.7  Smokeless Tobacco Never Used    Review of Systems  Constitutional: Negative.   HENT: Negative.   Eyes: Negative.   Respiratory: Negative.   Cardiovascular: Negative.   Gastrointestinal: Negative.   Genitourinary: Negative.   Musculoskeletal: Negative.   Skin: Negative.   Neurological: Negative.   Endo/Heme/Allergies: Negative.   Psychiatric/Behavioral: Negative.  Objective   Vitals:   03/31/21 1324  BP: 129/75  Pulse: (!) 48  Resp: 18  Temp: 98.3 F (36.8 C)  SpO2: 97%    Physical Exam Vitals reviewed.  Constitutional:      Appearance: Normal appearance. He is normal weight. He is not ill-appearing.  HENT:     Head: Normocephalic and atraumatic.  Cardiovascular:     Rate and Rhythm: Normal rate and regular rhythm.     Heart sounds: Normal heart sounds. No murmur heard. No friction rub. No gallop.   Pulmonary:     Effort: Pulmonary effort is normal. No respiratory distress.     Breath sounds: Normal breath sounds. No stridor. No wheezing, rhonchi or rales.  Abdominal:      General: Bowel sounds are normal. There is no distension.     Palpations: Abdomen is soft. There is no mass.     Tenderness: There is no abdominal tenderness. There is no guarding or rebound.     Hernia: A hernia is present.     Comments: Reducible right inguinal hernia.  Genitourinary:    Testes: Normal.  Skin:    General: Skin is warm and dry.  Neurological:     Mental Status: He is alert and oriented to person, place, and time.   Primary care notes reviewed  Assessment  Right inguinal hernia Plan   Patient is scheduled for right inguinal herniorrhaphy with mesh on 04/08/2021.  The risks and benefits of the procedure including bleeding, infection, mesh use, and the possibility of recurrence of the hernia were fully explained to the patient, who gave informed consent.

## 2021-03-31 NOTE — H&P (Signed)
Timothy Brennan; [id number]; 1946-01-08   HPI Patient is a 75 year old black male who was referred to my care by Dr. Allyn Kenner for evaluation and treatment of a right inguinal hernia.  Patient states he developed the hernia recently and it is increasing in size and causing him discomfort.  It does reduce when lying down.  Is made worse with straining. Past Medical History:  Diagnosis Date  . Coronary artery disease   . Diabetes mellitus   . Hyperlipidemia   . Hypertension     Past Surgical History:  Procedure Laterality Date  . CARDIAC CATHETERIZATION  2007   with stent placement  . COLONOSCOPY N/A 11/28/2019   Procedure: COLONOSCOPY;  Surgeon: Daneil Dolin, MD;  Location: [facility];  Service: Endoscopy;  Laterality: N/A;  1:00  . INGUINAL HERNIA REPAIR  11/15/2011   Procedure: HERNIA REPAIR INGUINAL ADULT;  Surgeon: Jamesetta So;  Location: [facility];  Service: General;  Laterality: Left;  . MOUTH SURGERY    . PARTIAL COLECTOMY N/A 12/12/2019   Procedure: PARTIAL COLECTOMY;  Surgeon: Aviva Signs, MD;  Location: [facility];  Service: General;  Laterality: N/A;  . POLYPECTOMY  11/28/2019   Procedure: POLYPECTOMY;  Surgeon: Daneil Dolin, MD;  Location: [facility];  Service: Endoscopy;;  . ROTATOR CUFF REPAIR     left    Family History  Problem Relation Age of Onset  . Cancer Mother   . Cancer Father   . Anesthesia problems Neg Hx   . Hypotension Neg Hx   . Malignant hyperthermia Neg Hx   . Pseudochol deficiency Neg Hx   . Colon cancer Neg Hx     Current Outpatient Medications on File Prior to Visit  Medication Sig Dispense Refill  . amLODipine (NORVASC) 10 MG tablet Take 10 mg by mouth daily.    Marland Kitchen aspirin 325 MG tablet Take 325 mg by mouth daily.    Marland Kitchen atorvastatin (LIPITOR) 10 MG tablet Take 10 mg by mouth daily.    . hydrochlorothiazide (,MICROZIDE/HYDRODIURIL,) 12.5 MG capsule TAKE 1 CAPSULE EVERY MORNING (Patient taking differently: Take 12.5 mg by mouth daily.) 30  capsule 6  . quinapril (ACCUPRIL) 20 MG tablet Take 20 mg by mouth daily.    . sitaGLIPtan-metformin (JANUMET) 50-500 MG per tablet Take 1 tablet by mouth 2 (two) times daily with a meal.     No current facility-administered medications on file prior to visit.    Allergies  Allergen Reactions  . Penicillins Hives and Nausea And Vomiting    Did it involve swelling of the face/tongue/throat, SOB, or low BP? No Did it involve sudden or severe rash/hives, skin peeling, or any reaction on the inside of your mouth or nose? No Did you need to seek medical attention at a hospital or doctor's office? No When did it last happen? If all above answers are "NO", may proceed with cephalosporin use.     Social History   Substance and Sexual Activity  Alcohol Use No    Social History   Tobacco Use  Smoking Status Former Smoker  . Quit date: 07/1969  . Years since quitting: 51.7  Smokeless Tobacco Never Used    Review of Systems  Constitutional: Negative.   HENT: Negative.   Eyes: Negative.   Respiratory: Negative.   Cardiovascular: Negative.   Gastrointestinal: Negative.   Genitourinary: Negative.   Musculoskeletal: Negative.   Skin: Negative.   Neurological: Negative.   Endo/Heme/Allergies: Negative.   Psychiatric/Behavioral: Negative.  Objective   Vitals:   03/31/21 1324  BP: 129/75  Pulse: (!) 48  Resp: 18  Temp: 98.3 F (36.8 C)  SpO2: 97%    Physical Exam Vitals reviewed.  Constitutional:      Appearance: Normal appearance. He is normal weight. He is not ill-appearing.  HENT:     Head: Normocephalic and atraumatic.  Cardiovascular:     Rate and Rhythm: Normal rate and regular rhythm.     Heart sounds: Normal heart sounds. No murmur heard. No friction rub. No gallop.   Pulmonary:     Effort: Pulmonary effort is normal. No respiratory distress.     Breath sounds: Normal breath sounds. No stridor. No wheezing, rhonchi or rales.  Abdominal:      General: Bowel sounds are normal. There is no distension.     Palpations: Abdomen is soft. There is no mass.     Tenderness: There is no abdominal tenderness. There is no guarding or rebound.     Hernia: A hernia is present.     Comments: Reducible right inguinal hernia.  Genitourinary:    Testes: Normal.  Skin:    General: Skin is warm and dry.  Neurological:     Mental Status: He is alert and oriented to person, place, and time.   Primary care notes reviewed  Assessment  Right inguinal hernia Plan   Patient is scheduled for right inguinal herniorrhaphy with mesh on 04/08/2021.  The risks and benefits of the procedure including bleeding, infection, mesh use, and the possibility of recurrence of the hernia were fully explained to the patient, who gave informed consent.

## 2021-04-01 NOTE — Patient Instructions (Signed)
Timothy Brennan  04/01/2021     @PREFPERIOPPHARMACY @   Your procedure is scheduled on  04/08/2021.   Report to [facility] at  [phone]  A.M.   Call this number if you have problems the morning of surgery:  [phone]   Remember:  Do not eat or drink after midnight.                        Take these medicines the morning of surgery with A SIP OF WATER    Amlodipine.     Please brush your teeth.  Do not wear jewelry, make-up or nail polish.  Do not wear lotions, powders, or perfumes, or deodorant.  Do not shave 48 hours prior to surgery.  Men may shave face and neck.  Do not bring valuables to the hospital.  [facility] is not responsible for any belongings or valuables.  Contacts, dentures or bridgework may not be worn into surgery.  Leave your suitcase in the car.  After surgery it may be brought to your room.  For patients admitted to the hospital, discharge time will be determined by your treatment team.  Patients discharged the day of surgery will not be allowed to drive home and must have someone with them for 24 hours.    Special instructions:  DO NOT smoke tobacco or vape for 24 hours before your procedure.  Please read over the following fact sheets that you were given. Pain Booklet, Surgical Site Infection Prevention, Anesthesia Post-op Instructions and Care and Recovery After Surgery           Open Hernia Repair, Adult, Care After What can I expect after the procedure? After the procedure, it is common to have:  Mild discomfort.  Slight bruising.  Mild swelling.  Pain in the belly (abdomen).  A small amount of blood from the cut from surgery (incision). Follow these instructions at home: Your doctor may give you more specific instructions. If you have problems, call your doctor. Medicines  Take over-the-counter and prescription medicines only as told by your doctor.  If told, take steps to prevent problems with pooping  (constipation). You may need to: ? Drink enough fluid to keep your pee (urine) pale yellow. ? Take medicines. You will be told what medicines to take. ? Eat foods that are high in fiber. These include beans, whole grains, and fresh fruits and vegetables. ? Limit foods that are high in fat and sugar. These include fried or sweet foods.  Ask your doctor if you should avoid driving or using machines while you are taking your medicine. Incision care  Follow instructions from your doctor about how to take care of your incision. Make sure you: ? Wash your hands with soap and water for at least 20 seconds before and after you change your bandage (dressing). If you cannot use soap and water, use hand sanitizer. ? Change your bandage. ? Leave stitches or skin glue in place for at least 2 weeks. ? Leave tape strips alone unless you are told to take them off. You may trim the edges of the tape strips if they curl up.  Check your incision every day for signs of infection. Check for: ? More redness, swelling, or pain. ? More fluid or blood. ? Warmth. ? Pus or a bad smell.  Wear loose, soft clothing while your incision heals.   Activity  Rest as told  by your doctor.  Do not lift anything that is heavier than 10 lb (4.5 kg), or the limit that you are told.  Do not play contact sports until your doctor says that this is safe.  If you were given a sedative during your procedure, do not drive or use machines until your doctor says that it is safe. A sedative is a medicine that helps you relax.  Return to your normal activities when your doctor says that it is safe.   General instructions  Do not take baths, swim, or use a hot tub. Ask your doctor about taking showers or sponge baths.  Hold a pillow over your belly when you cough or sneeze. This helps with pain.  Do not smoke or use any products that contain nicotine or tobacco. If you need help quitting, ask your doctor.  Keep all follow-up  visits. Contact a doctor if:  You have any of these signs of infection in or around your incision: ? More redness, swelling, or pain. ? More fluid or blood. ? Warmth. ? Pus. ? A bad smell.  You have a fever or chills.  You have blood in your poop (stool).  You have not pooped (had a bowel movement) in 2-3 days.  Medicine does not help your pain. Get help right away if:  You have chest pain, or you are short of breath.  You feel faint or light-headed.  You have very bad pain.  You vomit and your pain is worse.  You have pain, swelling, or redness in a leg. These symptoms may be an emergency. Get help right away. Call your local emergency services (911 in the [place]  Do not wait to see if the symptoms will go away.  Do not drive yourself to the hospital. Summary  After this procedure, it is common to have mild discomfort, slight bruising, and mild swelling.  Follow instructions from your doctor about how to take care of your cut from surgery (incision). Check every day for signs of infection.  Do not lift heavy objects or play contact sports until your doctor says it is safe.  Return to your normal activities as told by your doctor. This information is not intended to replace advice given to you by your health care provider. Make sure you discuss any questions you have with your health care provider. Document Revised: 06/09/2020 Document Reviewed: 06/09/2020 Elsevier Patient Education  2021 [place] Anesthesia, Adult, Care After This sheet gives you information about how to care for yourself after your procedure. Your health care provider may also give you more specific instructions. If you have problems or questions, contact your health care provider. What can I expect after the procedure? After the procedure, the following side effects are common:  Pain or discomfort at the IV site.  Nausea.  Vomiting.  Sore throat.  Trouble  concentrating.  Feeling cold or chills.  Feeling weak or tired.  Sleepiness and fatigue.  Soreness and body aches. These side effects can affect parts of the body that were not involved in surgery. Follow these instructions at home: For the time period you were told by your health care provider:  Rest.  Do not participate in activities where you could fall or become injured.  Do not drive or use machinery.  Do not drink alcohol.  Do not take sleeping pills or medicines that cause drowsiness.  Do not make important decisions or sign legal documents.  Do not take care of  children on your own.   Eating and drinking  Follow any instructions from your health care provider about eating or drinking restrictions.  When you feel hungry, start by eating small amounts of foods that are soft and easy to digest (bland), such as toast. Gradually return to your regular diet.  Drink enough fluid to keep your urine pale yellow.  If you vomit, rehydrate by drinking water, juice, or clear broth. General instructions  If you have sleep apnea, surgery and certain medicines can increase your risk for breathing problems. Follow instructions from your health care provider about wearing your sleep device: ? Anytime you are sleeping, including during daytime naps. ? While taking prescription pain medicines, sleeping medicines, or medicines that make you drowsy.  Have a responsible adult stay with you for the time you are told. It is important to have someone help care for you until you are awake and alert.  Return to your normal activities as told by your health care provider. Ask your health care provider what activities are safe for you.  Take over-the-counter and prescription medicines only as told by your health care provider.  If you smoke, do not smoke without supervision.  Keep all follow-up visits as told by your health care provider. This is important. Contact a health care provider  if:  You have nausea or vomiting that does not get better with medicine.  You cannot eat or drink without vomiting.  You have pain that does not get better with medicine.  You are unable to [place] urine.  You develop a skin rash.  You have a fever.  You have redness around your IV site that gets worse. Get help right away if:  You have difficulty breathing.  You have chest pain.  You have blood in your urine or stool, or you vomit blood. Summary  After the procedure, it is common to have a sore throat or nausea. It is also common to feel tired.  Have a responsible adult stay with you for the time you are told. It is important to have someone help care for you until you are awake and alert.  When you feel hungry, start by eating small amounts of foods that are soft and easy to digest (bland), such as toast. Gradually return to your regular diet.  Drink enough fluid to keep your urine pale yellow.  Return to your normal activities as told by your health care provider. Ask your health care provider what activities are safe for you. This information is not intended to replace advice given to you by your health care provider. Make sure you discuss any questions you have with your health care provider. Document Revised: 07/10/2020 Document Reviewed: 02/07/2020 Elsevier Patient Education  2021 [place]. How to Use Chlorhexidine for Bathing Chlorhexidine gluconate (CHG) is a germ-killing (antiseptic) solution that is used to clean the skin. It can get rid of the bacteria that normally live on the skin and can keep them away for about 24 hours. To clean your skin with CHG, you may be given:  A CHG solution to use in the shower or as part of a sponge bath.  A prepackaged cloth that contains CHG. Cleaning your skin with CHG may help lower the risk for infection:  While you are staying in the intensive care unit of the hospital.  If you have a vascular access, such as a central  line, to provide short-term or long-term access to your veins.  If you have  a catheter to drain urine from your bladder.  If you are on a ventilator. A ventilator is a machine that helps you breathe by moving air in and out of your lungs.  After surgery. What are the risks? Risks of using CHG include:  A skin reaction.  Hearing loss, if CHG gets in your ears.  Eye injury, if CHG gets in your eyes and is not rinsed out.  The CHG product catching fire. Make sure that you avoid smoking and flames after applying CHG to your skin. Do not use CHG:  If you have a chlorhexidine allergy or have previously reacted to chlorhexidine.  On babies younger than 23 months of age. How to use CHG solution  Use CHG only as told by your health care provider, and follow the instructions on the label.  Use the full amount of CHG as directed. Usually, this is one bottle. During a shower Follow these steps when using CHG solution during a shower (unless your health care provider gives you different instructions): 1. Start the shower. 2. Use your normal soap and shampoo to wash your face and hair. 3. Turn off the shower or move out of the shower stream. 4. Pour the CHG onto a clean washcloth. Do not use any type of brush or rough-edged sponge. 5. Starting at your neck, lather your body down to your toes. Make sure you follow these instructions: ? If you will be having surgery, pay special attention to the part of your body where you will be having surgery. Scrub this area for at least 1 minute. ? Do not use CHG on your head or face. If the solution gets into your ears or eyes, rinse them well with water. ? Avoid your genital area. ? Avoid any areas of skin that have broken skin, cuts, or scrapes. ? Scrub your back and under your arms. Make sure to wash skin folds. 6. Let the lather sit on your skin for 1-2 minutes or as long as told by your health care provider. 7. Thoroughly rinse your entire body in  the shower. Make sure that all body creases and crevices are rinsed well. 8. Dry off with a clean towel. Do not put any substances on your body afterward--such as powder, lotion, or perfume--unless you are told to do so by your health care provider. Only use lotions that are recommended by the manufacturer. 9. Put on clean clothes or pajamas. 10. If it is the night before your surgery, sleep in clean sheets.   During a sponge bath Follow these steps when using CHG solution during a sponge bath (unless your health care provider gives you different instructions): 1. Use your normal soap and shampoo to wash your face and hair. 2. Pour the CHG onto a clean washcloth. 3. Starting at your neck, lather your body down to your toes. Make sure you follow these instructions: ? If you will be having surgery, pay special attention to the part of your body where you will be having surgery. Scrub this area for at least 1 minute. ? Do not use CHG on your head or face. If the solution gets into your ears or eyes, rinse them well with water. ? Avoid your genital area. ? Avoid any areas of skin that have broken skin, cuts, or scrapes. ? Scrub your back and under your arms. Make sure to wash skin folds. 4. Let the lather sit on your skin for 1-2 minutes or as long as told by your  health care provider. 5. Using a different clean, wet washcloth, thoroughly rinse your entire body. Make sure that all body creases and crevices are rinsed well. 6. Dry off with a clean towel. Do not put any substances on your body afterward--such as powder, lotion, or perfume--unless you are told to do so by your health care provider. Only use lotions that are recommended by the manufacturer. 7. Put on clean clothes or pajamas. 8. If it is the night before your surgery, sleep in clean sheets. How to use CHG prepackaged cloths  Only use CHG cloths as told by your health care provider, and follow the instructions on the label.  Use the  CHG cloth on clean, dry skin.  Do not use the CHG cloth on your head or face unless your health care provider tells you to.  When washing with the CHG cloth: ? Avoid your genital area. ? Avoid any areas of skin that have broken skin, cuts, or scrapes. Before surgery Follow these steps when using a CHG cloth to clean before surgery (unless your health care provider gives you different instructions): 1. Using the CHG cloth, vigorously scrub the part of your body where you will be having surgery. Scrub using a back-and-forth motion for 3 minutes. The area on your body should be completely wet with CHG when you are done scrubbing. 2. Do not rinse. Discard the cloth and let the area air-dry. Do not put any substances on the area afterward, such as powder, lotion, or perfume. 3. Put on clean clothes or pajamas. 4. If it is the night before your surgery, sleep in clean sheets.   For general bathing Follow these steps when using CHG cloths for general bathing (unless your health care provider gives you different instructions). 1. Use a separate CHG cloth for each area of your body. Make sure you wash between any folds of skin and between your fingers and toes. Wash your body in the following order, switching to a new cloth after each step: ? The front of your neck, shoulders, and chest. ? Both of your arms, under your arms, and your hands. ? Your stomach and groin area, avoiding the genitals. ? Your right leg and foot. ? Your left leg and foot. ? The back of your neck, your back, and your buttocks. 2. Do not rinse. Discard the cloth and let the area air-dry. Do not put any substances on your body afterward--such as powder, lotion, or perfume--unless you are told to do so by your health care provider. Only use lotions that are recommended by the manufacturer. 3. Put on clean clothes or pajamas. Contact a health care provider if:  Your skin gets irritated after scrubbing.  You have questions about  using your solution or cloth. Get help right away if:  Your eyes become very red or swollen.  Your eyes itch badly.  Your skin itches badly and is red or swollen.  Your hearing changes.  You have trouble seeing.  You have swelling or tingling in your mouth or throat.  You have trouble breathing.  You swallow any chlorhexidine. Summary  Chlorhexidine gluconate (CHG) is a germ-killing (antiseptic) solution that is used to clean the skin. Cleaning your skin with CHG may help to lower your risk for infection.  You may be given CHG to use for bathing. It may be in a bottle or in a prepackaged cloth to use on your skin. Carefully follow your health care provider's instructions and the instructions on the  product label.  Do not use CHG if you have a chlorhexidine allergy.  Contact your health care provider if your skin gets irritated after scrubbing. This information is not intended to replace advice given to you by your health care provider. Make sure you discuss any questions you have with your health care provider. Document Revised: 04/11/2020 Document Reviewed: 04/11/2020 Elsevier Patient Education  [place].

## 2021-04-07 ENCOUNTER — Other Ambulatory Visit (HOSPITAL_COMMUNITY)
Admission: RE | Admit: 2021-04-07 | Discharge: 2021-04-07 | Disposition: A | Discharge status: Home / Self Care | Payer: Medicare HMO | Source: Ambulatory Visit | Attending: General Surgery | Admitting: General Surgery

## 2021-04-07 ENCOUNTER — Other Ambulatory Visit: Payer: Self-pay

## 2021-04-07 ENCOUNTER — Encounter (HOSPITAL_COMMUNITY)
Admission: RE | Admit: 2021-04-07 | Discharge: 2021-04-07 | Disposition: A | Discharge status: Home / Self Care | Payer: Medicare HMO | Source: Ambulatory Visit | Attending: General Surgery | Admitting: General Surgery

## 2021-04-07 ENCOUNTER — Encounter (HOSPITAL_COMMUNITY): Payer: Self-pay

## 2021-04-07 DIAGNOSIS — Z01818 Encounter for other preprocedural examination: Secondary | ICD-10-CM | POA: Insufficient documentation

## 2021-04-07 LAB — CBC WITH DIFFERENTIAL/PLATELET
Abs Immature Granulocytes: 0.02 10*3/uL (ref 0.00–0.07)
Basophils Absolute: 0 10*3/uL (ref 0.0–0.1)
Basophils Relative: 1 %
Eosinophils Absolute: 0.1 10*3/uL (ref 0.0–0.5)
Eosinophils Relative: 2 %
HCT: 40 % (ref 39.0–52.0)
Hemoglobin: 13.3 g/dL (ref 13.0–17.0)
Immature Granulocytes: 0 %
Lymphocytes Relative: 20 %
Lymphs Abs: 1.2 10*3/uL (ref 0.7–4.0)
MCH: 28.8 pg (ref 26.0–34.0)
MCHC: 33.3 g/dL (ref 30.0–36.0)
MCV: 86.6 fL (ref 80.0–100.0)
Monocytes Absolute: 0.4 10*3/uL (ref 0.1–1.0)
Monocytes Relative: 7 %
Neutro Abs: 4.2 10*3/uL (ref 1.7–7.7)
Neutrophils Relative %: 70 %
Platelets: 223 10*3/uL (ref 150–400)
RBC: 4.62 MIL/uL (ref 4.22–5.81)
RDW: 14.3 % (ref 11.5–15.5)
WBC: 6 10*3/uL (ref 4.0–10.5)
nRBC: 0 % (ref 0.0–0.2)

## 2021-04-07 LAB — BASIC METABOLIC PANEL
Anion gap: 10 (ref 5–15)
BUN: 22 mg/dL (ref 8–23)
CO2: 23 mmol/L (ref 22–32)
Calcium: 9.4 mg/dL (ref 8.9–10.3)
Chloride: 105 mmol/L (ref 98–111)
Creatinine, Ser: 1.37 mg/dL — ABNORMAL HIGH (ref 0.61–1.24)
GFR, Estimated: 54 mL/min — ABNORMAL LOW (ref >60)
Glucose, Bld: 131 mg/dL — ABNORMAL HIGH (ref 70–99)
Potassium: 3.8 mmol/L (ref 3.5–5.1)
Sodium: 138 mmol/L (ref 135–145)

## 2021-04-08 ENCOUNTER — Encounter (HOSPITAL_COMMUNITY)
Admission: RE | Disposition: A | Discharge status: Home / Self Care | Payer: Self-pay | Source: Home / Self Care | Attending: General Surgery

## 2021-04-08 ENCOUNTER — Encounter (HOSPITAL_COMMUNITY): Payer: Self-pay | Admitting: General Surgery

## 2021-04-08 ENCOUNTER — Ambulatory Visit (HOSPITAL_COMMUNITY): Payer: Medicare HMO | Admitting: Certified Registered Nurse Anesthetist

## 2021-04-08 ENCOUNTER — Ambulatory Visit (HOSPITAL_COMMUNITY)
Admission: RE | Admit: 2021-04-08 | Discharge: 2021-04-08 | Disposition: A | Discharge status: Home / Self Care | Payer: Medicare HMO | Attending: General Surgery | Admitting: General Surgery

## 2021-04-08 DIAGNOSIS — Z7982 Long term (current) use of aspirin: Secondary | ICD-10-CM | POA: Insufficient documentation

## 2021-04-08 DIAGNOSIS — D176 Benign lipomatous neoplasm of spermatic cord: Secondary | ICD-10-CM | POA: Insufficient documentation

## 2021-04-08 DIAGNOSIS — K4090 Unilateral inguinal hernia, without obstruction or gangrene, not specified as recurrent: Secondary | ICD-10-CM

## 2021-04-08 DIAGNOSIS — Z79899 Other long term (current) drug therapy: Secondary | ICD-10-CM | POA: Diagnosis not present

## 2021-04-08 DIAGNOSIS — Z87891 Personal history of nicotine dependence: Secondary | ICD-10-CM | POA: Diagnosis not present

## 2021-04-08 DIAGNOSIS — Z7984 Long term (current) use of oral hypoglycemic drugs: Secondary | ICD-10-CM | POA: Insufficient documentation

## 2021-04-08 DIAGNOSIS — Z880 Allergy status to penicillin: Secondary | ICD-10-CM | POA: Diagnosis not present

## 2021-04-08 HISTORY — PX: INGUINAL HERNIA REPAIR: SHX194

## 2021-04-08 LAB — HEMOGLOBIN A1C
Hgb A1c MFr Bld: 6 % — ABNORMAL HIGH (ref 4.8–5.6)
Mean Plasma Glucose: 126 mg/dL

## 2021-04-08 LAB — GLUCOSE, CAPILLARY
Glucose-Capillary: 87 mg/dL (ref 70–99)
Glucose-Capillary: 130 mg/dL — ABNORMAL HIGH (ref 70–99)

## 2021-04-08 SURGERY — REPAIR, HERNIA, INGUINAL, ADULT
Anesthesia: General | Site: Groin | Laterality: Right

## 2021-04-08 MED ORDER — GLYCOPYRROLATE PF 0.2 MG/ML IJ SOSY
PREFILLED_SYRINGE | INTRAMUSCULAR | Status: AC
  Filled 2021-04-08: qty 1

## 2021-04-08 MED ORDER — SODIUM CHLORIDE 0.9 % IR SOLN
Status: DC | PRN
  Administered 2021-04-08: 1

## 2021-04-08 MED ORDER — FENTANYL CITRATE (PF) 250 MCG/5ML IJ SOLN
INTRAMUSCULAR | Status: DC | PRN
  Administered 2021-04-08 (×5): 25 ug via INTRAVENOUS

## 2021-04-08 MED ORDER — FENTANYL CITRATE (PF) 100 MCG/2ML IJ SOLN
INTRAMUSCULAR | Status: AC
  Filled 2021-04-08: qty 2

## 2021-04-08 MED ORDER — LIDOCAINE HCL (CARDIAC) PF 100 MG/5ML IV SOSY
PREFILLED_SYRINGE | INTRAVENOUS | Status: DC | PRN
  Administered 2021-04-08: 60 mg via INTRAVENOUS

## 2021-04-08 MED ORDER — BUPIVACAINE LIPOSOME 1.3 % IJ SUSP
INTRAMUSCULAR | Status: DC | PRN
  Administered 2021-04-08: 20 mL

## 2021-04-08 MED ORDER — PROPOFOL 10 MG/ML IV BOLUS
INTRAVENOUS | Status: DC | PRN
  Administered 2021-04-08: 180 mg via INTRAVENOUS

## 2021-04-08 MED ORDER — PROPOFOL 10 MG/ML IV BOLUS
INTRAVENOUS | Status: AC
  Filled 2021-04-08: qty 20

## 2021-04-08 MED ORDER — CHLORHEXIDINE GLUCONATE CLOTH 2 % EX PADS
6.0000 | MEDICATED_PAD | Freq: Once | CUTANEOUS | Status: AC
  Administered 2021-04-08: 6 via TOPICAL

## 2021-04-08 MED ORDER — FENTANYL CITRATE (PF) 100 MCG/2ML IJ SOLN: 25.0000 ug | INTRAMUSCULAR | Status: DC | PRN

## 2021-04-08 MED ORDER — VANCOMYCIN HCL IN DEXTROSE 1-5 GM/200ML-% IV SOLN
1000.0000 mg | INTRAVENOUS | Status: AC
  Administered 2021-04-08: 1000 mg via INTRAVENOUS
  Filled 2021-04-08: qty 200

## 2021-04-08 MED ORDER — ONDANSETRON HCL 4 MG/2ML IJ SOLN
4.0000 mg | Freq: Once | INTRAMUSCULAR | Status: DC | PRN
Start: 2021-04-08 — End: 2021-04-08

## 2021-04-08 MED ORDER — BUPIVACAINE LIPOSOME 1.3 % IJ SUSP
INTRAMUSCULAR | Status: AC
  Filled 2021-04-08: qty 20

## 2021-04-08 MED ORDER — CHLORHEXIDINE GLUCONATE 0.12 % MT SOLN
15.0000 mL | Freq: Once | OROMUCOSAL | Status: AC
  Administered 2021-04-08: 15 mL via OROMUCOSAL

## 2021-04-08 MED ORDER — TRAMADOL HCL 50 MG PO TABS: 50.0000 mg | ORAL_TABLET | Freq: Four times a day (QID) | ORAL | 0 refills | Status: AC | PRN

## 2021-04-08 MED ORDER — DEXAMETHASONE SODIUM PHOSPHATE 10 MG/ML IJ SOLN
INTRAMUSCULAR | Status: DC | PRN
  Administered 2021-04-08: 5 mg via INTRAVENOUS

## 2021-04-08 MED ORDER — KETOROLAC TROMETHAMINE 30 MG/ML IJ SOLN
15.0000 mg | Freq: Once | INTRAMUSCULAR | Status: AC
  Administered 2021-04-08: 15 mg via INTRAVENOUS
  Filled 2021-04-08: qty 1

## 2021-04-08 MED ORDER — LACTATED RINGERS IV SOLN: INTRAVENOUS | Status: DC

## 2021-04-08 MED ORDER — GLYCOPYRROLATE 0.2 MG/ML IJ SOLN
INTRAMUSCULAR | Status: DC | PRN
  Administered 2021-04-08: .1 mg via INTRAVENOUS

## 2021-04-08 MED ORDER — EPHEDRINE 5 MG/ML INJ
INTRAVENOUS | Status: AC
  Filled 2021-04-08: qty 10

## 2021-04-08 MED ORDER — ONDANSETRON HCL 4 MG/2ML IJ SOLN
INTRAMUSCULAR | Status: AC
  Filled 2021-04-08: qty 2

## 2021-04-08 MED ORDER — DEXAMETHASONE SODIUM PHOSPHATE 10 MG/ML IJ SOLN
INTRAMUSCULAR | Status: AC
  Filled 2021-04-08: qty 1

## 2021-04-08 MED ORDER — ORAL CARE MOUTH RINSE: 15.0000 mL | Freq: Once | OROMUCOSAL | Status: AC

## 2021-04-08 MED ORDER — EPHEDRINE SULFATE 50 MG/ML IJ SOLN
INTRAMUSCULAR | Status: DC | PRN
  Administered 2021-04-08: 10 mg via INTRAVENOUS
  Administered 2021-04-08: 5 mg via INTRAVENOUS

## 2021-04-08 MED ORDER — ONDANSETRON HCL 4 MG/2ML IJ SOLN
INTRAMUSCULAR | Status: DC | PRN
  Administered 2021-04-08: 4 mg via INTRAVENOUS

## 2021-04-08 SURGICAL SUPPLY — 37 items
ADH SKN CLS APL DERMABOND .7 (GAUZE/BANDAGES/DRESSINGS) ×1
CLOTH BEACON ORANGE TIMEOUT ST (SAFETY) ×2 IMPLANT
COVER LIGHT HANDLE STERIS (MISCELLANEOUS) ×4 IMPLANT
COVER WAND RF STERILE (DRAPES) ×2 IMPLANT
DERMABOND ADVANCED (GAUZE/BANDAGES/DRESSINGS) ×1
DERMABOND ADVANCED .7 DNX12 (GAUZE/BANDAGES/DRESSINGS) ×1 IMPLANT
DRAIN PENROSE 0.5X18 (DRAIN) ×2 IMPLANT
ELECT REM PT RETURN 9FT ADLT (ELECTROSURGICAL) ×2
ELECTRODE REM PT RTRN 9FT ADLT (ELECTROSURGICAL) ×1 IMPLANT
GAUZE SPONGE 4X4 12PLY STRL (GAUZE/BANDAGES/DRESSINGS) ×2 IMPLANT
GLOVE SURG SS PI 7.5 STRL IVOR (GLOVE) ×2 IMPLANT
GLOVE SURG UNDER POLY LF SZ7 (GLOVE) ×6 IMPLANT
GOWN STRL REUS W/TWL LRG LVL3 (GOWN DISPOSABLE) ×6 IMPLANT
INST SET MINOR GENERAL (KITS) ×2 IMPLANT
KIT TURNOVER KIT A (KITS) ×2 IMPLANT
MANIFOLD NEPTUNE II (INSTRUMENTS) ×2 IMPLANT
MESH HERNIA 1.6X1.9 PLUG LRG (Mesh General) ×1 IMPLANT
MESH HERNIA PLUG LRG (Mesh General) ×1 IMPLANT
NEEDLE HYPO 18GX1.5 BLUNT FILL (NEEDLE) ×2 IMPLANT
NEEDLE HYPO 21X1.5 SAFETY (NEEDLE) ×2 IMPLANT
NS IRRIG 1000ML POUR BTL (IV SOLUTION) ×2 IMPLANT
PACK MINOR (CUSTOM PROCEDURE TRAY) ×2 IMPLANT
PAD ARMBOARD 7.5X6 YLW CONV (MISCELLANEOUS) ×2 IMPLANT
PENCIL SMOKE EVACUATOR (MISCELLANEOUS) ×2 IMPLANT
SET BASIN LINEN APH (SET/KITS/TRAYS/PACK) ×2 IMPLANT
SOL PREP PROV IODINE SCRUB 4OZ (MISCELLANEOUS) ×2 IMPLANT
SUT MNCRL AB 4-0 PS2 18 (SUTURE) ×2 IMPLANT
SUT NOVA NAB GS-22 2 2-0 T-19 (SUTURE) ×4 IMPLANT
SUT SILK 3 0 (SUTURE)
SUT SILK 3-0 18XBRD TIE 12 (SUTURE) IMPLANT
SUT VIC AB 2-0 CT1 27 (SUTURE) ×2
SUT VIC AB 2-0 CT1 TAPERPNT 27 (SUTURE) ×1 IMPLANT
SUT VIC AB 3-0 SH 27 (SUTURE) ×2
SUT VIC AB 3-0 SH 27X BRD (SUTURE) ×1 IMPLANT
SUT VICRYL AB 2 0 TIES (SUTURE) ×2 IMPLANT
SUT VICRYL AB 3 0 TIES (SUTURE) IMPLANT
SYR 20ML LL LF (SYRINGE) ×4 IMPLANT

## 2021-04-08 NOTE — Anesthesia Procedure Notes (Signed)
Procedure Name: LMA Insertion Date/Time: 04/08/2021 8:54 AM Performed by: Karna Dupes, CRNA Pre-anesthesia Checklist: Patient identified, Emergency Drugs available, Suction available and Patient being monitored Patient Re-evaluated:Patient Re-evaluated prior to induction Oxygen Delivery Method: Circle system utilized Preoxygenation: Pre-oxygenation with 100% oxygen Induction Type: IV induction LMA: LMA inserted LMA Size: 4.0 Number of attempts: 1 Placement Confirmation: positive ETCO2 and breath sounds checked- equal and bilateral Tube secured with: Tape Dental Injury: Teeth and Oropharynx as per pre-operative assessment

## 2021-04-08 NOTE — Interval H&P Note (Signed)
History and Physical Interval Note:  04/08/2021 8:25 AM  Timothy Brennan  has presented today for surgery, with the diagnosis of Right Inguinal hernia.  The various methods of treatment have been discussed with the patient and family. After consideration of risks, benefits and other options for treatment, the patient has consented to  Procedure(s): HERNIA REPAIR INGUINAL ADULT W/MESH (Right) as a surgical intervention.  The patient's history has been reviewed, patient examined, no change in status, stable for surgery.  I have reviewed the patient's chart and labs.  Questions were answered to the patient's satisfaction.     Aviva Signs

## 2021-04-08 NOTE — Transfer of Care (Signed)
Immediate Anesthesia Transfer of Care Note  Patient: Ronald L Ciaravino  Procedure(s) Performed: HERNIA REPAIR INGUINAL ADULT W/MESH (Right Groin)  Patient Location: PACU  Anesthesia Type:General  Level of Consciousness: drowsy  Airway & Oxygen Therapy: Patient Spontanous Breathing and Patient connected to nasal cannula oxygen  Post-op Assessment: Report given to RN and Post -op Vital signs reviewed and stable  Post vital signs: Reviewed and stable  Last Vitals:  Vitals Value Taken Time  BP    Temp    Pulse 53   Resp 11 04/08/21 0947  SpO2 98   Vitals shown include unvalidated device data.  Last Pain:  Vitals:   04/08/21 0829  TempSrc: Oral  PainSc: 0-No pain         Complications: No complications documented.

## 2021-04-08 NOTE — Op Note (Signed)
Patient:  Timothy Brennan  DOB:  10-02-1946  MRN:  [id number]   Preop Diagnosis: Right inguinal hernia  Postop Diagnosis: Same  Procedure: Right inguinal herniorrhaphy with mesh  Surgeon: Aviva Signs, MD  Anes: General  Indications: Patient is a 75 year old black male who presents with a symptomatic right inguinal hernia.  The risks and benefits of the procedure including bleeding, infection, mesh use, the possibility of recurrence of the hernia were fully explained to the patient, who gave informed consent.  Procedure note: The patient was placed in supine position.  After general anesthesia was administered, the right groin region was prepped and draped using the usual sterile technique with Betadine.  Surgical site confirmation was performed.  Incision was made in the right groin region down to the external oblique aponeurosis.  The aponeurosis was incised to the external ring.  A Penrose drain was placed around the spermatic cord.  The vas deferens was noted within the spermatic cord.  The ilioinguinal nerve was identified.  The patient had a lipoma of the cord and this was excised and a high ligation was performed using a 2-0 Vicryl tie.  An indirect hernia sac was found.  This was freed away from the spermatic cord up to the peritoneal reflection and inverted.  A large Bard PerFix plug was then placed.  An onlay patch was placed along the floor of the inguinal canal and secured superiorly to the conjoined tendon and inferiorly to the shelving edge of Poupart's ligament using 2-0 Novafil interrupted sutures.  The internal ring was recreated using a 2-0 Novafil interrupted suture.  The external Bleich aponeurosis was reapproximated using a 2-0 Vicryl running suture.  Subcutaneous layer was reapproximated using 3-0 Vicryl interrupted suture.  Exparel was instilled into the surrounding wound.  The skin was closed using a 4-0 Monocryl subcuticular suture.  Dermabond was applied.  All tape and  needle counts were correct at the end of the procedure.  The patient was awakened and transferred to PACU in stable condition.  Complications: None  EBL: Minimal  Specimen: None

## 2021-04-08 NOTE — Anesthesia Preprocedure Evaluation (Signed)
Anesthesia Evaluation  Patient identified by MRN, date of birth, ID band Patient awake    Reviewed: Allergy & Precautions, H&P , NPO status , Patient's Chart, lab work & pertinent test results, reviewed documented beta blocker date and time   Airway Mallampati: II  TM Distance: >3 FB Neck ROM: full    Dental no notable dental hx. (+) Teeth Intact   Pulmonary neg pulmonary ROS, former smoker,    Pulmonary exam normal breath sounds clear to auscultation       Cardiovascular Exercise Tolerance: Good hypertension, + CAD   Rhythm:regular Rate:Normal     Neuro/Psych negative neurological ROS  negative psych ROS   GI/Hepatic negative GI ROS, Neg liver ROS,   Endo/Other  negative endocrine ROSdiabetes  Renal/GU negative Renal ROS  negative genitourinary   Musculoskeletal   Abdominal   Peds  Hematology negative hematology ROS (+)   Anesthesia Other Findings   Reproductive/Obstetrics negative OB ROS                             Anesthesia Physical Anesthesia Plan  ASA: III  Anesthesia Plan: General and General LMA   Post-op Pain Management:    Induction:   PONV Risk Score and Plan: Ondansetron  Airway Management Planned:   Additional Equipment:   Intra-op Plan:   Post-operative Plan:   Informed Consent: I have reviewed the patients History and Physical, chart, labs and discussed the procedure including the risks, benefits and alternatives for the proposed anesthesia with the patient or authorized representative who has indicated his/her understanding and acceptance.     Dental Advisory Given  Plan Discussed with: CRNA  Anesthesia Plan Comments:         Anesthesia Quick Evaluation

## 2021-04-08 NOTE — Anesthesia Postprocedure Evaluation (Signed)
Anesthesia Post Note  Patient: Timothy Brennan  Procedure(s) Performed: HERNIA REPAIR INGUINAL ADULT W/MESH (Right Groin)  Patient location during evaluation: Phase II Anesthesia Type: General Level of consciousness: awake Pain management: pain level controlled Vital Signs Assessment: post-procedure vital signs reviewed and stable Respiratory status: spontaneous breathing and respiratory function stable Cardiovascular status: blood pressure returned to baseline and stable Postop Assessment: no headache and no apparent nausea or vomiting Anesthetic complications: no Comments: Late entry   No complications documented.   Last Vitals:  Vitals:   04/08/21 1032 04/08/21 1033  BP:  (!) 166/74  Pulse: (!) 46   Resp: 16   Temp: 36.5 C   SpO2: 99%     Last Pain:  Vitals:   04/08/21 1032  TempSrc: Axillary  PainSc: [name]

## 2021-04-08 NOTE — Discharge Instructions (Signed)
Monitored Anesthesia Care, Care After This sheet gives you information about how to care for yourself after your procedure. Your health care provider may also give you more specific instructions. If you have problems or questions, contact your health care provider. What can I expect after the procedure? After the procedure, it is common to have:  Tiredness.  Forgetfulness about what happened after the procedure.  Impaired judgment for important decisions.  Nausea or vomiting.  Some difficulty with balance. Follow these instructions at home: For the time period you were told by your health care provider:  Rest as needed.  Do not participate in activities where you could fall or become injured.  Do not drive or use machinery.  Do not drink alcohol.  Do not take sleeping pills or medicines that cause drowsiness.  Do not make important decisions or sign legal documents.  Do not take care of children on your own.      Eating and drinking  Follow the diet that is recommended by your health care provider.  Drink enough fluid to keep your urine pale yellow.  If you vomit: ? Drink water, juice, or soup when you can drink without vomiting. ? Make sure you have little or no nausea before eating solid foods. General instructions  Have a responsible adult stay with you for the time you are told. It is important to have someone help care for you until you are awake and alert.  Take over-the-counter and prescription medicines only as told by your health care provider.  If you have sleep apnea, surgery and certain medicines can increase your risk for breathing problems. Follow instructions from your health care provider about wearing your sleep device: ? Anytime you are sleeping, including during daytime naps. ? While taking prescription pain medicines, sleeping medicines, or medicines that make you drowsy.  Avoid smoking.  Keep all follow-up visits as told by your health  care provider. This is important. Contact a health care provider if:  You keep feeling nauseous or you keep vomiting.  You feel light-headed.  You are still sleepy or having trouble with balance after 24 hours.  You develop a rash.  You have a fever.  You have redness or swelling around the IV site. Get help right away if:  You have trouble breathing.  You have new-onset confusion at home. Summary  For several hours after your procedure, you may feel tired. You may also be forgetful and have poor judgment.  Have a responsible adult stay with you for the time you are told. It is important to have someone help care for you until you are awake and alert.  Rest as told. Do not drive or operate machinery. Do not drink alcohol or take sleeping pills.  Get help right away if you have trouble breathing, or if you suddenly become confused. This information is not intended to replace advice given to you by your health care provider. Make sure you discuss any questions you have with your health care provider. Document Revised: 07/10/2020 Document Reviewed: 09/27/2019 Elsevier Patient Education  2021 [place] [employer], Adult, Care After The following information offers guidance on how to care for yourself after your procedure. Your health care provider may also give you more specific instructions. If you have problems or questions, contact your health care provider. What can I expect after the procedure? After the procedure, it is common to have:  Mild discomfort.  Slight bruising.  Minor swelling.  Pain in the abdomen.  A small amount of blood from the incision. Follow these instructions at home: Medicines  Take over-the-counter and prescription medicines only as told by your health care provider.  Ask your health care provider if the medicine prescribed to you: ? Requires you to avoid driving or using machinery. ? Can cause constipation. You may need to  take these actions to prevent or treat constipation:  Drink enough fluid to keep your urine pale yellow.  Take over-the-counter or prescription medicines.  Eat foods that are high in fiber, such as beans, whole grains, and fresh fruits and vegetables.  Limit foods that are high in fat and processed sugars, such as fried or sweet foods.  If you were prescribed an antibiotic medicine, take it as told by your health care provider. Do not stop using the antibiotic even if you start to feel better. Incision care  Follow instructions from your health care provider about how to take care of your incision. Make sure you: ? Wash your hands with soap and water for at least 20 seconds before and after you change your bandage (dressing). If soap and water are not available, use hand sanitizer. ? Change your dressing as told by your health care provider. ? Leave stitches (sutures), skin glue, or adhesive strips in place. These skin closures may need to stay in place for 2 weeks or longer. If adhesive strip edges start to loosen and curl up, you may trim the loose edges. Do not remove adhesive strips completely unless your health care provider tells you to do that.  Check your incision area every day for signs of infection. Check for: ? More redness, swelling, or pain. ? More fluid or blood. ? Warmth. ? Pus or a bad smell.  Wear loose, soft clothing while your incision heals.   Activity  Rest as told by your health care provider.  Do not lift anything that is heavier than 10 lb (4.5 kg), or the limit that you are told, until your health care provider says that it is safe.  Do not play contact sports until your health care provider says that this is safe.  If you were given a sedative during the procedure, it can affect you for several hours. Do not drive or operate machinery until your health care provider says that it is safe.  Return to your normal activities as told by your health care  provider. Ask your health care provider what activities are safe for you.   General instructions  Do not take baths, swim, or use a hot tub until your health care provider approves. Ask your health care provider if you may take showers. You may only be allowed to take sponge baths.  Hold a pillow over your abdomen when you cough or sneeze. This helps with pain.  Do not use any products that contain nicotine or tobacco. These products include cigarettes, chewing tobacco, and vaping devices, such as e-cigarettes. If you need help quitting, ask your health care provider.  Keep all follow-up visits. This is important. Contact a health care provider if:  You have any of these signs of infection: ? More redness, swelling, or pain around your incision. ? More fluid or blood coming from your incision. ? Warmth coming from your incision. ? Pus or a bad smell coming from your incision. ? A fever or chills.  You have blood in your stool (feces).  You have not had a bowel movement in 2-3 days.  Your pain is not controlled with medicine. Get help right away if:  You have chest pain or shortness of breath.  You feel faint or light-headed.  You have severe pain.  You vomit and your pain is worse.  You have pain, swelling, or redness in a leg. These symptoms may represent a serious problem that is an emergency. Do not wait to see if the symptoms will go away. Get medical help right away. Call your local emergency services (911 in the [place] Do not drive yourself to the hospital. Summary  After an open hernia repair, it is common to have mild discomfort, slight bruising, and minor swelling.  Follow instructions from your health care provider about how to take care of your incision. Check every day for signs of infection.  Do not lift heavy objects or play contact sports until your health care provider says it is safe.  Return to your normal activities as told by your health care provider.  Ask your health care provider what activities are safe for you. This information is not intended to replace advice given to you by your health care provider. Make sure you discuss any questions you have with your health care provider. Document Revised: 06/09/2020 Document Reviewed: 06/09/2020 Elsevier Patient Education  2021 [employer].

## 2021-04-09 ENCOUNTER — Encounter (HOSPITAL_COMMUNITY): Payer: Self-pay | Admitting: General Surgery

## 2021-05-07 ENCOUNTER — Encounter: Payer: Self-pay | Admitting: General Surgery

## 2021-05-07 ENCOUNTER — Ambulatory Visit (INDEPENDENT_AMBULATORY_CARE_PROVIDER_SITE_OTHER): Payer: Medicare HMO | Admitting: General Surgery

## 2021-05-07 ENCOUNTER — Other Ambulatory Visit: Payer: Self-pay

## 2021-05-07 VITALS — BP 141/57 | HR 55 | Temp 97.7°F | Resp 14 | Ht 68.0 in | Wt 174.4 lb

## 2021-05-07 DIAGNOSIS — Z09 Encounter for follow-up examination after completed treatment for conditions other than malignant neoplasm: Secondary | ICD-10-CM

## 2021-05-07 NOTE — Progress Notes (Signed)
Subjective:     [place]  Patient here for postoperative visit, status post right inguinal herniorrhaphy.  Patient is doing well.  His incisional pain has resolved.  He has minimal tingling sensation along the incision.  He has been increasing his activity as able. Objective:    BP (!) 141/57   Pulse (!) 55   Temp 97.7 F (36.5 C) (Oral)   Resp 14   Ht 5\' 8"  (1.727 m)   Wt 174 lb 6.4 oz (79.1 kg)   SpO2 96%   BMI 26.52 kg/m   General:  alert, cooperative, and no distress  Right inguinal incision healing well.  No swelling noted.     Assessment:    Doing well postoperatively.    Plan:   May return to work without restrictions on 05/12/2021.  Follow-up here as needed.

## 2021-07-09 IMAGING — DX DG CHEST 2V
2 series · 2 of 2 positions shown · non-contrast
Comparison: 12/14/2013

CLINICAL DATA: History of hypertension, preoperative evaluation

EXAM:
CHEST - 2 VIEW

[chest pa]
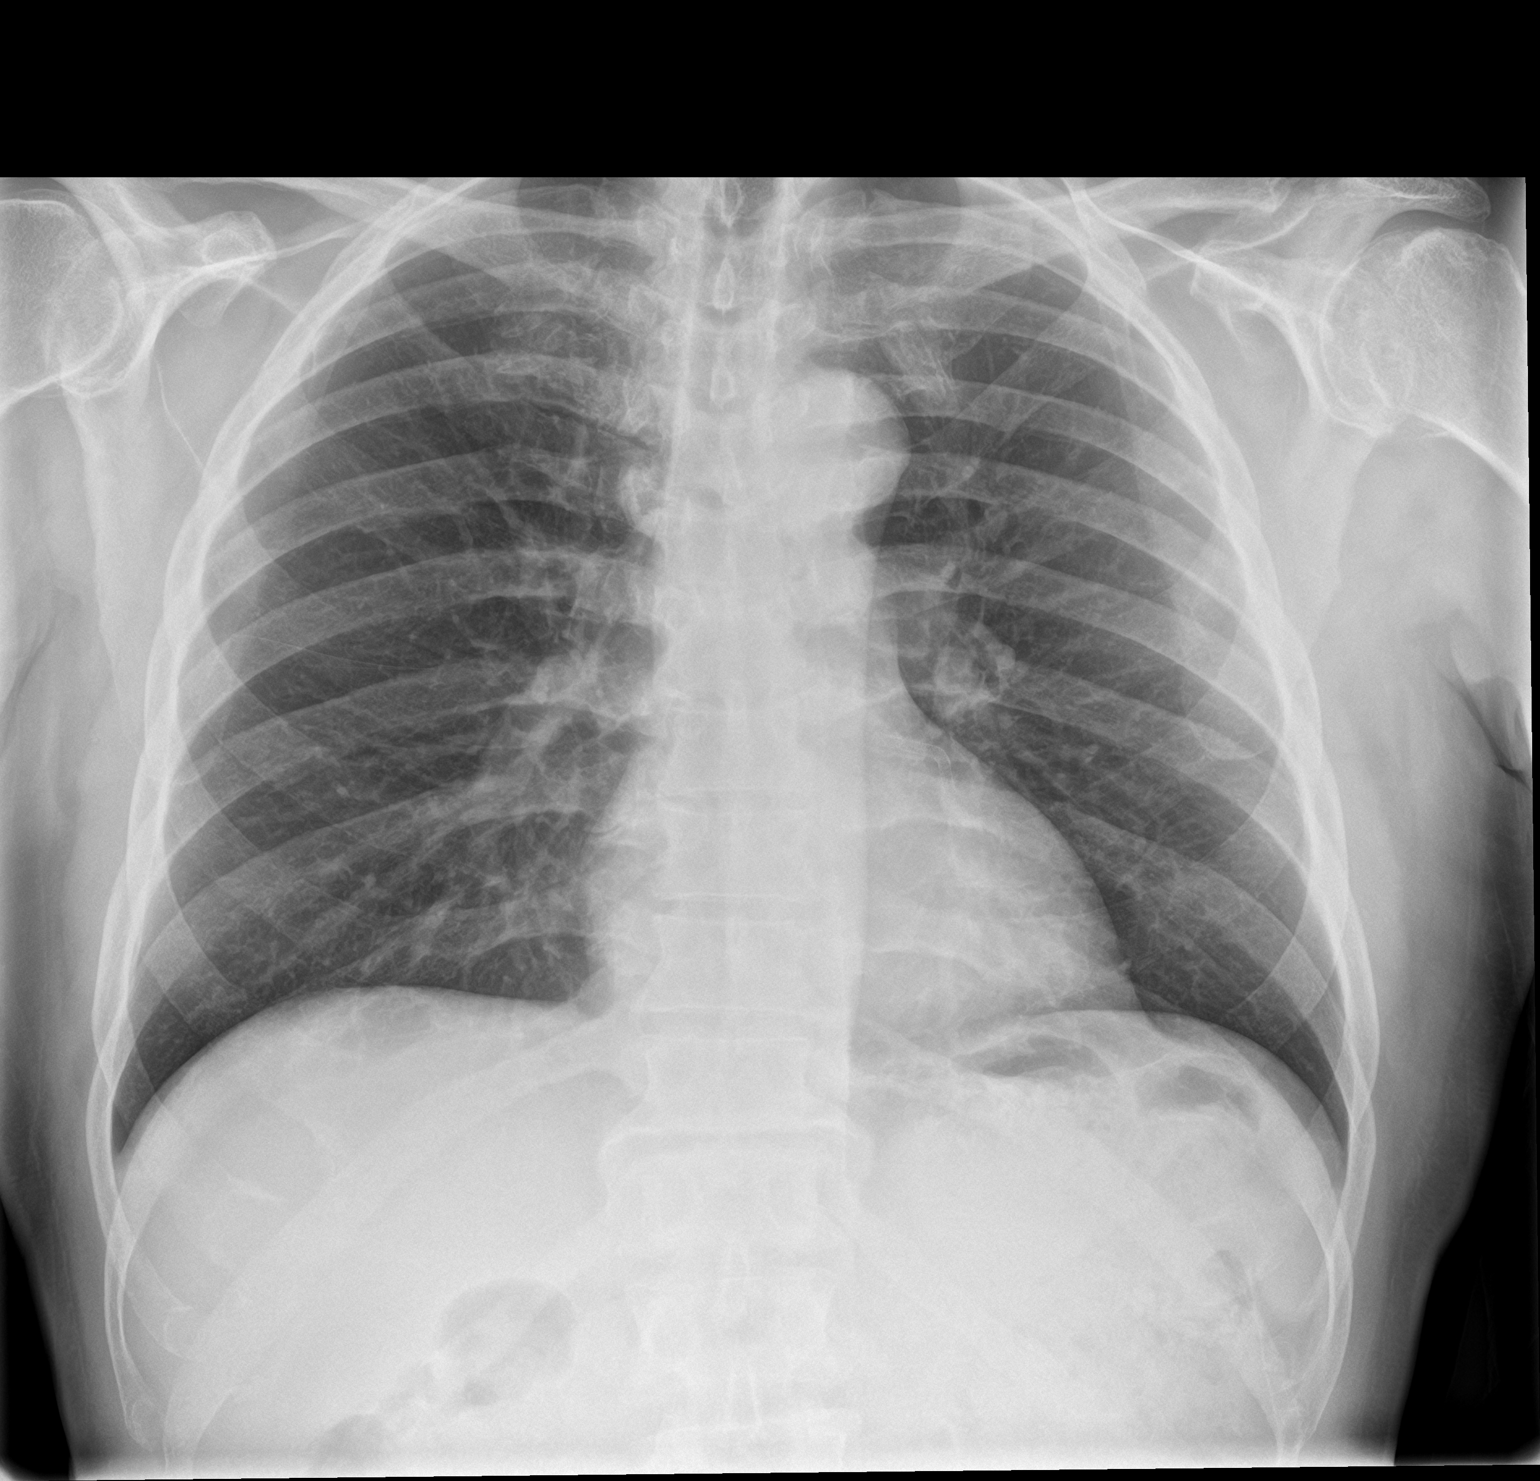

[chest lat]
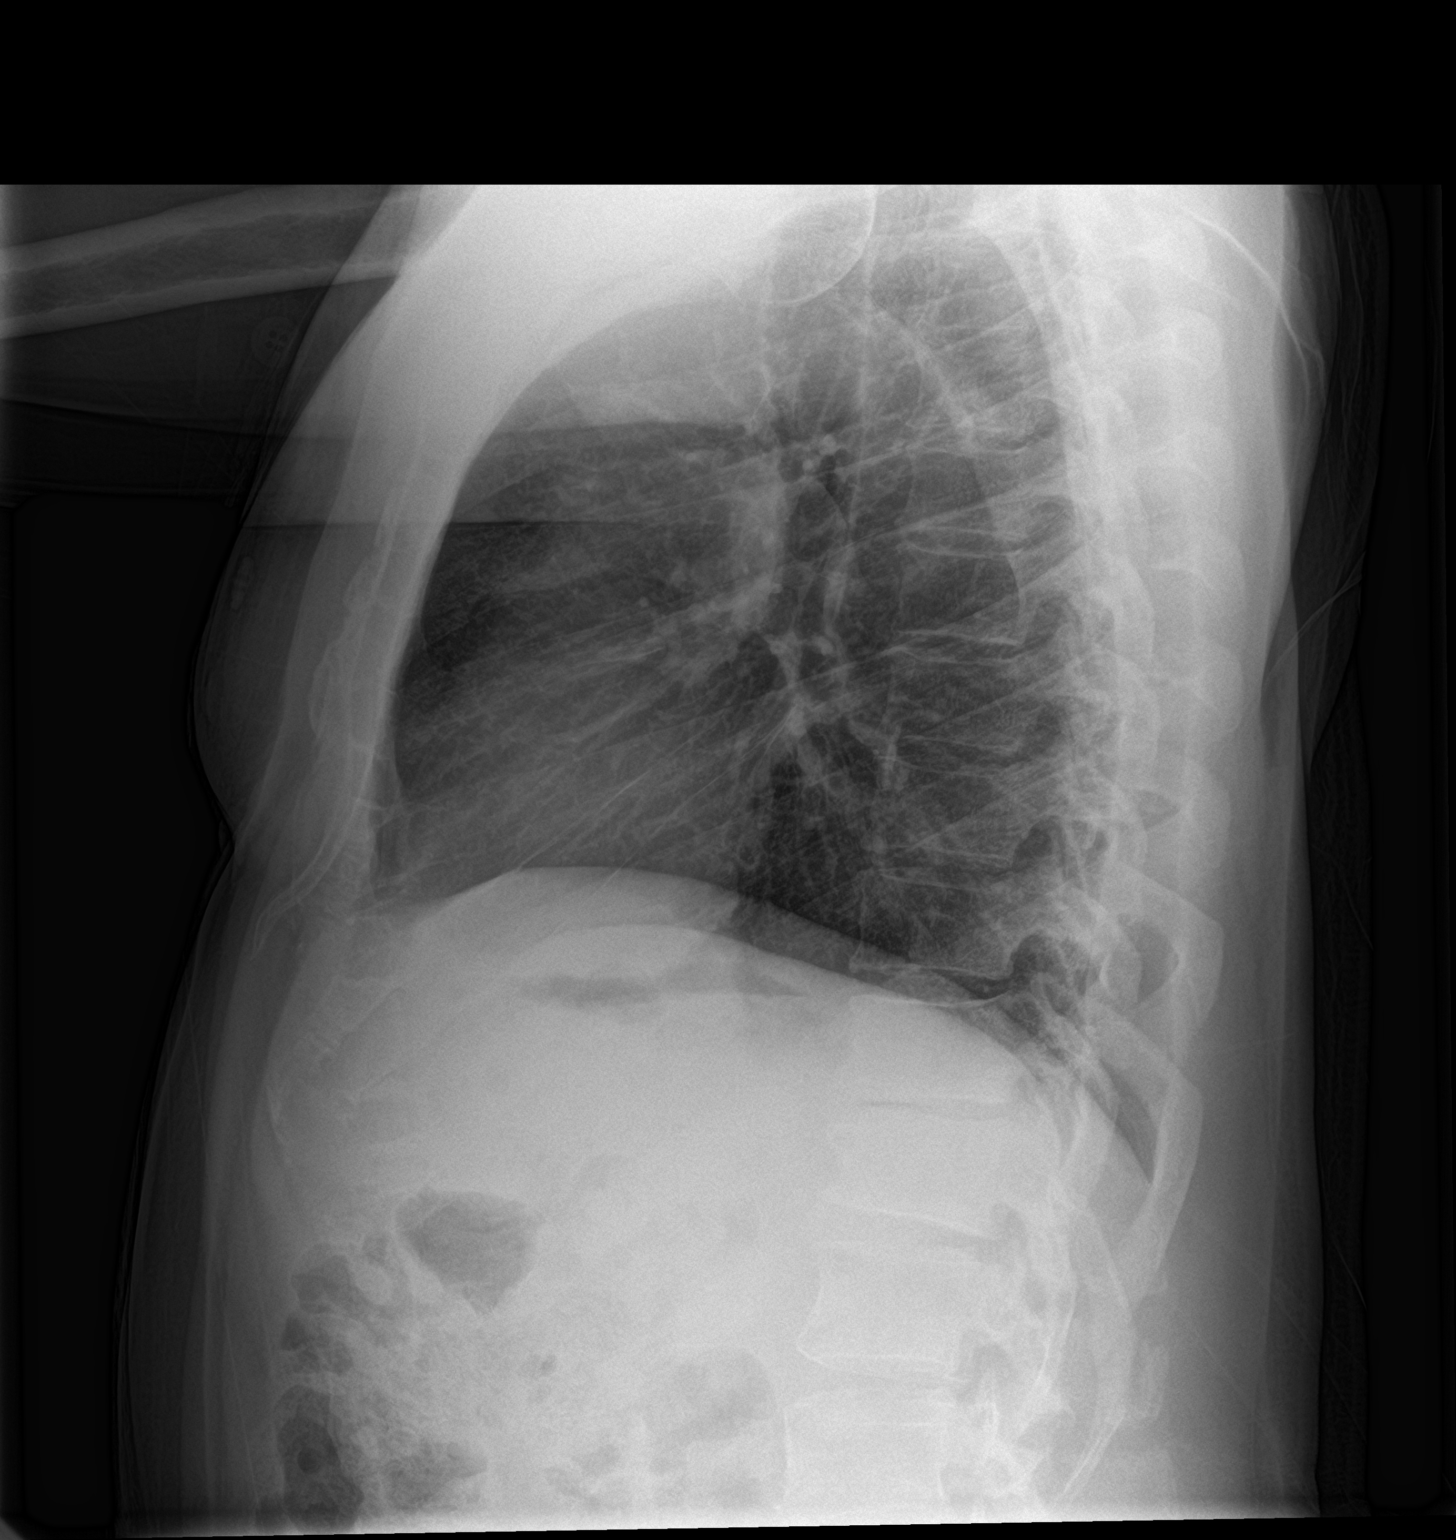

[2 of 2 positions shown; findings below may reference images not displayed]

FINDINGS: The heart size and mediastinal contours are within normal limits.
Both lungs are clear. The visualized skeletal structures are
unremarkable.
IMPRESSION: No active cardiopulmonary disease.

## 2022-02-13 NOTE — Progress Notes (Signed)
CARDIOLOGY CONSULT NOTE  ? ? ? ? ? ?Patient ID: ?Timothy Brennan ?MRN: [id number] ?DOB/AGE: [birth year] 74 y.o. ? ?Admit date: (Not on file) ?Referring Physician: Nevada Crane ?Primary Physician: Celene Squibb, MD ?Primary Cardiologist: New ?Reason for Consultation: Bradycardia ? ?Active Problems: ?  * No active hospital problems. * ? ? ?HPI:  76 y.o. referred by Dr Nevada Crane for bradycardia Has not seen cardiology in over 10 years History of CAD/stents, HTN, HLD and DM has some fatigue with exertion No chest pain. HR in 50's with PR 274 msec first degree AV block Not on beta blocker On Accupril  norvasc, HCTZ and statin Some postural dizziness in middle of night  ? ?Review Epic note from Dr Verl Blalock 2012 indicated stent to LAD 2000 with low risk myovue 10/2004 EF 49% small apical infarct with mild peri infarct ischemia  ? ?Wife of over 74 years really concerned. He cannot golf or do yard work like he use to without being weak and dyspnic. No chest pain.  Indicates an episode of syncope last year with vagal overtones in [place]  ? ?ROS ?All other systems reviewed and negative except as noted above ? ?Past Medical History:  ?Diagnosis Date  ? Coronary artery disease   ? Diabetes mellitus   ? Hyperlipidemia   ? Hypertension   ?  ?Family History  ?Problem Relation Age of Onset  ? Cancer Mother   ? Cancer Father   ? Anesthesia problems Neg Hx   ? Hypotension Neg Hx   ? Malignant hyperthermia Neg Hx   ? Pseudochol deficiency Neg Hx   ? Colon cancer Neg Hx   ?  ?Social History  ? ?Socioeconomic History  ? Marital status: Married  ?  Spouse name: Not on file  ? Number of children: Not on file  ? Years of education: Not on file  ? Highest education level: Not on file  ?Occupational History  ? Not on file  ?Tobacco Use  ? Smoking status: Former  ?  Types: Cigarettes  ?  Quit date: 07/1969  ?  Years since quitting: 52.6  ? Smokeless tobacco: Never  ?Vaping Use  ? Vaping Use: Not on file  ?Substance and Sexual Activity  ? Alcohol use: No  ? Drug use:  No  ? Sexual activity: Yes  ?Other Topics Concern  ? Not on file  ?Social History Narrative  ? Not on file  ? ?Social Determinants of Health  ? ?Financial Resource Strain: Not on file  ?Food Insecurity: Not on file  ?Transportation Needs: Not on file  ?Physical Activity: Not on file  ?Stress: Not on file  ?Social Connections: Not on file  ?Intimate Partner Violence: Not on file  ?  ?Past Surgical History:  ?Procedure Laterality Date  ? CARDIAC CATHETERIZATION  2007  ? with stent placement  ? COLONOSCOPY N/A 11/28/2019  ? Procedure: COLONOSCOPY;  Surgeon: Daneil Dolin, MD;  Location: [facility];  Service: Endoscopy;  Laterality: N/A;  1:00  ? INGUINAL HERNIA REPAIR  11/15/2011  ? Procedure: HERNIA REPAIR INGUINAL ADULT;  Surgeon: Jamesetta So;  Location: [facility];  Service: General;  Laterality: Left;  ? INGUINAL HERNIA REPAIR Right 04/08/2021  ? Procedure: HERNIA REPAIR INGUINAL ADULT W/MESH;  Surgeon: Aviva Signs, MD;  Location: [facility];  Service: General;  Laterality: Right;  ? MOUTH SURGERY    ? PARTIAL COLECTOMY N/A 12/12/2019  ? Procedure: PARTIAL COLECTOMY;  Surgeon: Aviva Signs, MD;  Location: [facility];  Service: General;  Laterality: N/A;  ? POLYPECTOMY  11/28/2019  ? Procedure: POLYPECTOMY;  Surgeon: Daneil Dolin, MD;  Location: [facility];  Service: Endoscopy;;  ? ROTATOR CUFF REPAIR    ? left  ?  ? ? ?Current Outpatient Medications:  ?  amLODipine (NORVASC) 10 MG tablet, Take 10 mg by mouth daily., Disp: , Rfl:  ?  aspirin 325 MG tablet, Take 325 mg by mouth daily., Disp: , Rfl:  ?  atorvastatin (LIPITOR) 10 MG tablet, Take 10 mg by mouth daily., Disp: , Rfl:  ?  Carboxymethylcellul-Glycerin (CLEAR EYES FOR DRY EYES) 1-0.25 % SOLN, Place 1 drop into both eyes daily as needed (dry eyes)., Disp: , Rfl:  ?  hydrochlorothiazide (,MICROZIDE/HYDRODIURIL,) 12.5 MG capsule, TAKE 1 CAPSULE EVERY MORNING (Patient taking differently: Take 12.5 mg by mouth daily.), Disp: 30 capsule, Rfl: 6 ?  quinapril  (ACCUPRIL) 20 MG tablet, Take 20 mg by mouth daily., Disp: , Rfl:  ?  sitaGLIPtan-metformin (JANUMET) 50-500 MG per tablet, Take 1 tablet by mouth 2 (two) times daily with a meal., Disp: , Rfl:  ?  traMADol (ULTRAM) 50 MG tablet, Take 1 tablet (50 mg total) by mouth every 6 (six) hours as needed., Disp: 25 tablet, Rfl: 0 ? ? ? ?Physical Exam: ?There were no vitals taken for this visit.   ? ?Affect appropriate ?Healthy:  appears stated age ?HEENT: normal ?Neck supple with no adenopathy ?JVP normal no bruits no thyromegaly ?Lungs clear with no wheezing and good diaphragmatic motion ?Heart:  S1/S2 no murmur, no rub, gallop or click ?PMI normal ?Abdomen: benighn, BS positve, no tenderness, no AAA ?no bruit.  No HSM or HJR ?Distal pulses intact with no bruits ?No edema ?Neuro non-focal ?Skin warm and dry ?No muscular weakness ? ? ?Labs: ?  ?Lab Results  ?Component Value Date  ? WBC 6.0 04/07/2021  ? HGB 13.3 04/07/2021  ? HCT 40.0 04/07/2021  ? MCV 86.6 04/07/2021  ? PLT 223 04/07/2021  ? No results for input(s): NA, K, CL, CO2, BUN, CREATININE, CALCIUM, PROT, BILITOT, ALKPHOS, ALT, AST, GLUCOSE in the last 168 hours. ? ?Invalid input(s): LABALBU ?Lab Results  ?Component Value Date  ? TROPONINI <0.30 12/14/2013  ? ?  ?Radiology: ?No results found. ? ?EKG: SB rate 48 PR 270 msec LAD May 2022  SB rate 47 sinus arrhythmia no AV block ? ? ?ASSESSMENT AND PLAN:  ? ?CAD:  Stent to LAD in 2000 has not seen cardiology in 13 years. 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  Continue current medications and low sodium Dash type diet.   ?HLD:  continue statin labs with primary  ?DM:  Discussed low carb diet.  Target hemoglobin A1c is 6.5 or less.  Continue current medications. ? ? ?Lexiscan myovue ?TTE ?30 day monitor  ? ?F/U after testing   ? ? ?Signed: ?Jenkins Rouge ?02/13/2022, 2:26 PM ? ? ?

## 2022-02-22 ENCOUNTER — Ambulatory Visit: Payer: Medicare HMO | Admitting: Cardiovascular Disease

## 2022-02-22 ENCOUNTER — Encounter: Payer: Self-pay | Admitting: Cardiovascular Disease

## 2022-02-22 ENCOUNTER — Encounter: Payer: Self-pay | Admitting: *Deleted

## 2022-02-22 VITALS — BP 124/72 | HR 47 | Ht 68.0 in | Wt 171.0 lb

## 2022-02-22 DIAGNOSIS — I2510 Atherosclerotic heart disease of native coronary artery without angina pectoris: Secondary | ICD-10-CM

## 2022-02-22 DIAGNOSIS — R5383 Other fatigue: Secondary | ICD-10-CM | POA: Diagnosis not present

## 2022-02-22 DIAGNOSIS — R001 Bradycardia, unspecified: Secondary | ICD-10-CM | POA: Diagnosis not present

## 2022-02-22 DIAGNOSIS — R0609 Other forms of dyspnea: Secondary | ICD-10-CM

## 2022-02-22 DIAGNOSIS — I10 Essential (primary) hypertension: Secondary | ICD-10-CM | POA: Diagnosis not present

## 2022-02-22 NOTE — Patient Instructions (Signed)
Medication Instructions:  ?Your physician recommends that you continue on your current medications as directed. Please refer to the Current Medication list given to you today. ? ?*If you need a refill on your cardiac medications before your next appointment, please call your pharmacy* ? ? ?Lab Work: ?NONE  ? ?If you have labs (blood work) drawn today and your tests are completely normal, you will receive your results only by: ?MyChart Message (if you have MyChart) OR ?A paper copy in the mail ?If you have any lab test that is abnormal or we need to change your treatment, we will call you to review the results. ? ? ?Testing/Procedures: ?Your physician has recommended that you wear an event monitor. Event monitors are medical devices that record the heart?s electrical activity. Doctors most often [place] these monitors to diagnose arrhythmias. Arrhythmias are problems with the speed or rhythm of the heartbeat. The monitor is a small, portable device. You can wear one while you do your normal daily activities. This is usually used to diagnose what is causing palpitations/syncope (passing out). ? ?Your physician has requested that you have an echocardiogram. Echocardiography is a painless test that uses sound waves to create images of your heart. It provides your doctor with information about the size and shape of your heart and how well your heart?s chambers and valves are working. This procedure takes approximately one hour. There are no restrictions for this procedure. ? ?Your physician has requested that you have en exercise stress myoview. For further information please visit [email or web address]. Please follow instruction sheet, as given.  ? ? ?Follow-Up: ?At [facility], you and your health needs are our priority.  As part of our continuing mission to provide you with exceptional heart care, we have created designated Provider Care Teams.  These Care Teams include your primary Cardiologist (physician) and Advanced  Practice Providers (APPs -  Physician Assistants and Nurse Practitioners) who all work together to provide you with the care you need, when you need it. ? ?We recommend signing up for the patient portal called "MyChart".  Sign up information is provided on this After Visit Summary.  MyChart is used to connect with patients for Virtual Visits (Telemedicine).  Patients are able to view lab/test results, encounter notes, upcoming appointments, etc.  Non-urgent messages can be sent to your provider as well.   ?To learn more about what you can do with MyChart, go to [email or web address].   ? ?Your next appointment:   ?4 -6 week(s) ? ?The format for your next appointment:   ?In Person ? ?Provider:   ?You may see Dr. Johnsie Cancel or one of the following Advanced Practice Providers on your designated Care Team:   ?Bernerd Pho, PA-C  ?Ermalinda Barrios, PA-C   ? ? ?Other Instructions ?Thank you for choosing [facility]! ? ? ? ?Important Information About [employer] ? ? ? ? ?  ?

## 2022-03-03 ENCOUNTER — Ambulatory Visit (HOSPITAL_COMMUNITY)
Admission: RE | Admit: 2022-03-03 | Discharge: 2022-03-03 | Disposition: A | Discharge status: Home / Self Care | Payer: Medicare HMO | Source: Ambulatory Visit | Attending: Cardiovascular Disease | Admitting: Cardiovascular Disease

## 2022-03-03 ENCOUNTER — Encounter (HOSPITAL_COMMUNITY)
Admission: RE | Admit: 2022-03-03 | Discharge: 2022-03-03 | Disposition: A | Discharge status: Home / Self Care | Payer: Medicare HMO | Source: Ambulatory Visit | Attending: Cardiovascular Disease | Admitting: Cardiovascular Disease

## 2022-03-03 ENCOUNTER — Encounter (HOSPITAL_COMMUNITY): Payer: Self-pay

## 2022-03-03 DIAGNOSIS — R9439 Abnormal result of other cardiovascular function study: Secondary | ICD-10-CM | POA: Diagnosis not present

## 2022-03-03 DIAGNOSIS — R5383 Other fatigue: Secondary | ICD-10-CM | POA: Diagnosis present

## 2022-03-03 LAB — NM MYOCAR MULTI W/SPECT W/WALL MOTION / EF
LV dias vol: 109 mL (ref 62–150)
LV sys vol: 53 mL
Nuc Stress EF: 51 %
Peak HR: 84 {beats}/min
RATE: 0.2
Rest HR: 42 {beats}/min
Rest Nuclear Isotope Dose: 10.9 mCi
SDS: 0
SRS: 1
SSS: 1
ST Depression (mm): 0 mm
Stress Nuclear Isotope Dose: 33 mCi
TID: 1.09

## 2022-03-03 MED ORDER — TECHNETIUM TC 99M TETROFOSMIN IV KIT
10.0000 | PACK | Freq: Once | INTRAVENOUS | Status: AC | PRN
  Administered 2022-03-03: 10.9 via INTRAVENOUS

## 2022-03-03 MED ORDER — TECHNETIUM TC 99M TETROFOSMIN IV KIT
30.0000 | PACK | Freq: Once | INTRAVENOUS | Status: AC | PRN
  Administered 2022-03-03: 33 via INTRAVENOUS

## 2022-03-03 MED ORDER — REGADENOSON 0.4 MG/5ML IV SOLN
INTRAVENOUS | Status: AC
  Administered 2022-03-03: 0.4 mg via INTRAVENOUS
  Filled 2022-03-03: qty 5

## 2022-03-03 MED ORDER — SODIUM CHLORIDE FLUSH 0.9 % IV SOLN
INTRAVENOUS | Status: AC
  Administered 2022-03-03: 10 mL via INTRAVENOUS
  Filled 2022-03-03: qty 10

## 2022-03-08 ENCOUNTER — Ambulatory Visit (HOSPITAL_COMMUNITY)
Admission: RE | Admit: 2022-03-08 | Discharge: 2022-03-08 | Disposition: A | Discharge status: Home / Self Care | Payer: Medicare HMO | Source: Ambulatory Visit | Attending: Cardiovascular Disease | Admitting: Cardiovascular Disease

## 2022-03-08 DIAGNOSIS — R5383 Other fatigue: Secondary | ICD-10-CM | POA: Insufficient documentation

## 2022-03-08 DIAGNOSIS — I10 Essential (primary) hypertension: Secondary | ICD-10-CM | POA: Diagnosis not present

## 2022-03-08 DIAGNOSIS — R0609 Other forms of dyspnea: Secondary | ICD-10-CM

## 2022-03-08 DIAGNOSIS — R001 Bradycardia, unspecified: Secondary | ICD-10-CM | POA: Insufficient documentation

## 2022-03-08 LAB — ECHOCARDIOGRAM COMPLETE
AR max vel: 2.76 cm2
AV Area VTI: 2.8 cm2
AV Area mean vel: 2.72 cm2
AV Mean grad: 3 mmHg
AV Peak grad: 7.1 mmHg
Ao pk vel: 1.33 m/s
Area-P 1/2: 2.32 cm2
Calc EF: 58.3 %
MV VTI: 2.21 cm2
S' Lateral: 2.8 cm
Single Plane A2C EF: 56.1 %
Single Plane A4C EF: 60.1 %

## 2022-03-08 NOTE — Progress Notes (Signed)
*  PRELIMINARY RESULTS* ?Echocardiogram ?2D Echocardiogram has been performed. ? ?Timothy Brennan ?03/08/2022, 4:04 PM ?

## 2022-03-11 ENCOUNTER — Ambulatory Visit (INDEPENDENT_AMBULATORY_CARE_PROVIDER_SITE_OTHER): Payer: Medicare HMO

## 2022-03-11 ENCOUNTER — Other Ambulatory Visit: Payer: Self-pay

## 2022-03-11 DIAGNOSIS — R001 Bradycardia, unspecified: Secondary | ICD-10-CM

## 2022-03-15 ENCOUNTER — Telehealth: Payer: Self-pay | Admitting: Student

## 2022-03-15 NOTE — Telephone Encounter (Signed)
? ?  Received page from [place] about a Critical EKG. Called and spoke with rep. Patient reportedly had an 18 second episode of [place]itz type 2 secondary degree AV block at 5:42am Central Time this morning. This was an automatic trigger. Reviewed strips with Tommye Standard, EP APP, who felt like it was more consistent with [place] with intermittent 2:1 AV block. Patient has a history of 1st degree AV block. He is not on any AV nodal agents. Given this occurred during early morning hours, may just be due to increased vagal tone. I tried to call patient to ensure he was not having any symptoms but did not get an answer. Asked patient to call back. If patient did not have any symptoms this morning, would recommend waiting for full monitor results. I will also route this to Dr. Johnsie Cancel so he is aware. ? ?Darreld Mclean, PA-C ?03/15/2022 8:18 AM ? ?

## 2022-03-16 NOTE — Telephone Encounter (Signed)
Pt walked in office and states that he received a call from our office regarding his monitor. Pt denies any sx while wearing monitor. Informed that if he does not develop sx that we would wait for monitor to finish and discuss results at that time.  ?

## 2022-04-20 ENCOUNTER — Ambulatory Visit: Payer: Medicare HMO | Admitting: Internal Medicine

## 2022-04-20 ENCOUNTER — Ambulatory Visit: Payer: Medicare HMO | Admitting: Student

## 2022-04-20 ENCOUNTER — Encounter: Payer: Self-pay | Admitting: Internal Medicine

## 2022-04-20 VITALS — BP 130/50 | HR 50 | Ht 68.0 in | Wt 171.0 lb

## 2022-04-20 DIAGNOSIS — I10 Essential (primary) hypertension: Secondary | ICD-10-CM | POA: Diagnosis not present

## 2022-04-20 NOTE — Progress Notes (Signed)
HPI Timothy Brennan is referred by Dr. Johnsie Cancel for evaluation of bradycardia. 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 He had a 2D echo with preserved LV function and a stress myoview demonstrating a very small scar with no ischemia. He wore a 30 day monitor which demonstrated daytime and night time transient AV block, both type one and two mobitz block, but no CHB and no long pauses. His ave HR was in the 50's. He is able to walk playing golf. No limit.  Allergies  Allergen Reactions   Penicillins Hives and Nausea And Vomiting    Did it involve swelling of the face/tongue/throat, SOB, or low BP? No Did it involve sudden or severe rash/hives, skin peeling, or any reaction on the inside of your mouth or nose? No Did you need to seek medical attention at a hospital or doctor's office? No When did it last happen?       If all above answers are "NO", may proceed with cephalosporin use.      Current Outpatient Medications  Medication Sig Dispense Refill   amLODipine (NORVASC) 10 MG tablet Take 10 mg by mouth daily.     aspirin 325 MG tablet Take 325 mg by mouth daily.     atorvastatin (LIPITOR) 10 MG tablet Take 10 mg by mouth daily.     Carboxymethylcellul-Glycerin (CLEAR EYES FOR DRY EYES) 1-0.25 % SOLN Place 1 drop into both eyes daily as needed (dry eyes).     hydrochlorothiazide (,MICROZIDE/HYDRODIURIL,) 12.5 MG capsule TAKE 1 CAPSULE EVERY MORNING 30 capsule 6   quinapril (ACCUPRIL) 20 MG tablet Take 20 mg by mouth daily.     sitaGLIPtan-metformin (JANUMET) 50-500 MG per tablet Take 1 tablet by mouth 2 (two) times daily with a meal.     traMADol (ULTRAM) 50 MG tablet Take 1 tablet (50 mg total) by mouth every 6 (six) hours as needed. 25 tablet 0   No current facility-administered medications for this visit.     Past Medical History:  Diagnosis Date   Coronary artery disease    Diabetes mellitus    Hyperlipidemia    Hypertension      ROS:   All systems reviewed and negative except as noted in the HPI.   Past Surgical History:  Procedure Laterality Date   CARDIAC CATHETERIZATION  2007   with stent placement   COLONOSCOPY N/A 11/28/2019   Procedure: COLONOSCOPY;  Surgeon: Daneil Dolin, MD;  [facility];  Service: Endoscopy;  Laterality: N/A;  1:00   INGUINAL HERNIA REPAIR  11/15/2011   Procedure: HERNIA REPAIR INGUINAL ADULT;  Surgeon: Jamesetta So;  Location: [facility];  Service: General;  Laterality: Left;   INGUINAL HERNIA REPAIR Right 04/08/2021   Procedure: HERNIA REPAIR INGUINAL ADULT W/MESH;  Surgeon: Aviva Signs, MD;  Location: [facility];  Service: General;  Laterality: Right;   MOUTH SURGERY     PARTIAL COLECTOMY N/A 12/12/2019   Procedure: PARTIAL COLECTOMY;  Surgeon: Aviva Signs, MD;  Location: [facility];  Service: General;  Laterality: N/A;   POLYPECTOMY  11/28/2019   Procedure: POLYPECTOMY;  Surgeon: Daneil Dolin, MD;  [facility];  Service: Endoscopy;;   ROTATOR CUFF REPAIR     left     Family History  Problem Relation Age of Onset   Cancer Mother    Cancer Father    Anesthesia problems Neg Hx    Hypotension  Neg Hx    Malignant hyperthermia Neg Hx    Pseudochol deficiency Neg Hx    Colon cancer Neg Hx      Social History   Socioeconomic History   Marital status: Married    Spouse name: Not on file   Number of children: Not on file   Years of education: Not on file   Highest education level: Not on file  Occupational History   Not on file  Tobacco Use   Smoking status: Former    Types: Cigarettes    Quit date: 07/1969    Years since quitting: 52.8   Smokeless tobacco: Never  Vaping Use   Vaping Use: Not on file  Substance and Sexual Activity   Alcohol use: No   Drug use: No   Sexual activity: Yes  Other Topics Concern   Not on file  Social History Narrative   Not on file   Social Determinants of Health   Financial Resource Strain: Not on file   Food Insecurity: Not on file  Transportation Needs: Not on file  Physical Activity: Not on file  Stress: Not on file  Social Connections: Not on file  Intimate Partner Violence: Not on file     Ht '5\' 8"'$  (1.727 m)   Wt 171 lb (77.6 kg)   BMI 26.00 kg/m   Physical Exam:  Well appearing NAD HEENT: Unremarkable Neck:  No JVD, no thyromegally Lymphatics:  No adenopathy Back:  No CVA tenderness Lungs:  Clear with no wheezes HEART:  Regular rate rhythm, no murmurs, no rubs, no clicks Abd:  soft, positive bowel sounds, no organomegally, no rebound, no guarding Ext:  2 plus pulses, no edema, no cyanosis, no clubbing Skin:  No rashes no nodules Neuro:  CN II through XII intact, motor grossly intact  EKG - reviewed. NSR with AVWB   Assess/Plan:  Second degree AV block - he does not appear to be symptomatic. I have reviewed the symptoms he might experience if his conduction system worsens and he will call us and let us know if this occurs.  CAD - he denies anginal symptoms s/p PCI remotely  [facility]

## 2022-04-20 NOTE — Patient Instructions (Signed)
Medication Instructions:  No changes to your medications.  Labwork: No labwork at this time.  Testing/Procedures: No testing or procedures at this time.  Follow-Up: As needed.  Any Other Special Instructions Will Be Listed Below (If Applicable).     If you need a refill on your cardiac medications before your next appointment, please call your pharmacy.

## 2023-03-09 ENCOUNTER — Encounter: Payer: Self-pay | Admitting: Podiatry

## 2023-03-09 ENCOUNTER — Ambulatory Visit: Payer: Medicare HMO | Admitting: Podiatry

## 2023-03-09 DIAGNOSIS — B351 Tinea unguium: Secondary | ICD-10-CM | POA: Diagnosis not present

## 2023-03-09 DIAGNOSIS — M79675 Pain in left toe(s): Secondary | ICD-10-CM | POA: Diagnosis not present

## 2023-03-09 DIAGNOSIS — Q828 Other specified congenital malformations of skin: Secondary | ICD-10-CM | POA: Diagnosis not present

## 2023-03-09 DIAGNOSIS — E119 Type 2 diabetes mellitus without complications: Secondary | ICD-10-CM | POA: Diagnosis not present

## 2023-03-09 DIAGNOSIS — M79674 Pain in right toe(s): Secondary | ICD-10-CM

## 2023-03-09 NOTE — Progress Notes (Signed)
This patient presents to my office for at risk foot care.  This patient requires this care by a professional since this patient will be at risk due to having diabetes.  This patient is unable to cut nails himself since the patient cannot reach his nails.These nails are painful walking and wearing shoes.  He also has painful callus on the outside of his left foot. which is painful when walking.T  his patient presents for at risk foot care today.  General Appearance  Alert, conversant and in no acute stress.  Vascular  Dorsalis pedis and posterior tibial  pulses are palpable  bilaterally.  Capillary return is within normal limits  bilaterally. Temperature is within normal limits  bilaterally.  Neurologic  Senn-Weinstein monofilament wire test within normal limits  bilaterally. Muscle power within normal limits bilaterally.  Nails Thick disfigured discolored nails with subungual debris  from hallux to fifth toes bilaterally. No evidence of bacterial infection or drainage bilaterally.  Orthopedic  No limitations of motion  feet .  No crepitus or effusions noted.  No bony pathology or digital deformities noted.  Skin  normotropic skin with no porokeratosis noted bilaterally.  No signs of infections or ulcers noted.   Porokeratosis sub 5th left foot.  Onychomycosis  Pain in right toes  Pain in left toes  Porokeratosis left foot.  Consent was obtained for treatment procedures.   Mechanical debridement of nails 1-5  bilaterally performed with a nail nipper.  Filed with dremel without incident.    Return office visit      3 months                Told patient to return for periodic foot care and evaluation due to potential at risk complications.   Helane Gunther DPM

## 2023-06-09 ENCOUNTER — Ambulatory Visit (INDEPENDENT_AMBULATORY_CARE_PROVIDER_SITE_OTHER): Payer: Medicare HMO | Admitting: Podiatry

## 2023-06-09 ENCOUNTER — Encounter: Payer: Self-pay | Admitting: Podiatry

## 2023-06-09 ENCOUNTER — Ambulatory Visit: Payer: Medicare HMO | Admitting: Podiatry

## 2023-06-09 DIAGNOSIS — M79675 Pain in left toe(s): Secondary | ICD-10-CM | POA: Diagnosis not present

## 2023-06-09 DIAGNOSIS — E119 Type 2 diabetes mellitus without complications: Secondary | ICD-10-CM

## 2023-06-09 DIAGNOSIS — Q828 Other specified congenital malformations of skin: Secondary | ICD-10-CM

## 2023-06-09 DIAGNOSIS — B351 Tinea unguium: Secondary | ICD-10-CM

## 2023-06-09 DIAGNOSIS — M79674 Pain in right toe(s): Secondary | ICD-10-CM | POA: Diagnosis not present

## 2023-06-09 NOTE — Progress Notes (Signed)
This patient returns to my office for at risk foot care.  This patient requires this care by a professional since this patient will be at risk due to having diabetes.  This patient is unable to cut nails himself since the patient cannot reach his nails.These nails are painful walking and wearing shoes. He has developed This patient presents for at risk foot care today.  General Appearance  Alert, conversant and in no acute stress.  Vascular  Dorsalis pedis and posterior tibial  pulses are palpable  bilaterally.  Capillary return is within normal limits  bilaterally. Temperature is within normal limits  bilaterally.  Neurologic  Senn-Weinstein monofilament wire test within normal limits  bilaterally. Muscle power within normal limits bilaterally.  Nails Thick disfigured discolored nails with subungual debris  from hallux to fifth toes bilaterally. No evidence of bacterial infection or drainage bilaterally.  Orthopedic  No limitations of motion  feet .  No crepitus or effusions noted.  No bony pathology or digital deformities noted.  Skin  normotropic skin with no porokeratosis noted bilaterally.  No signs of infections or ulcers noted.     Onychomycosis  Pain in right toes  Pain in left toes  Consent was obtained for treatment procedures.   Mechanical debridement of nails 1-5  bilaterally performed with a nail nipper.  Filed with dremel without incident.    Return office visit    3 months                  Told patient to return for periodic foot care and evaluation due to potential at risk complications.   Helane Gunther DPM

## 2023-06-28 ENCOUNTER — Other Ambulatory Visit (HOSPITAL_COMMUNITY): Payer: Self-pay | Admitting: Internal Medicine

## 2023-06-28 DIAGNOSIS — R2689 Other abnormalities of gait and mobility: Secondary | ICD-10-CM

## 2023-07-07 ENCOUNTER — Ambulatory Visit (HOSPITAL_COMMUNITY)
Admission: RE | Admit: 2023-07-07 | Discharge: 2023-07-07 | Disposition: A | Discharge status: Home / Self Care | Payer: Medicare HMO | Source: Ambulatory Visit | Attending: Internal Medicine | Admitting: Internal Medicine

## 2023-07-07 DIAGNOSIS — R2689 Other abnormalities of gait and mobility: Secondary | ICD-10-CM | POA: Diagnosis not present

## 2023-07-15 ENCOUNTER — Ambulatory Visit (HOSPITAL_COMMUNITY): Payer: Medicare HMO | Attending: Internal Medicine | Admitting: Physical Therapy

## 2023-07-15 ENCOUNTER — Other Ambulatory Visit: Payer: Self-pay

## 2023-07-15 DIAGNOSIS — R2689 Other abnormalities of gait and mobility: Secondary | ICD-10-CM | POA: Diagnosis present

## 2023-07-15 DIAGNOSIS — R2681 Unsteadiness on feet: Secondary | ICD-10-CM | POA: Diagnosis present

## 2023-07-15 DIAGNOSIS — M6281 Muscle weakness (generalized): Secondary | ICD-10-CM | POA: Insufficient documentation

## 2023-07-15 NOTE — Therapy (Signed)
OUTPATIENT PHYSICAL THERAPY NEURO EVALUATION   Patient Name: Timothy Brennan MRN: [id number] DOB:1946/04/23, 77 y.o., male Today's Date: 07/15/2023   PCP: Benita Stabile, MD REFERRING PROVIDER: Benita Stabile, MD  END OF SESSION:  PT End of Session - 07/15/23 0949     Visit Number 1    Number of Visits 16    Date for PT Re-Evaluation 09/09/23    Authorization Type [employer]    Progress Note Due on Visit 10    PT Start Time 0950    PT Stop Time 1030    PT Time Calculation (min) 40 min    Activity Tolerance Patient tolerated treatment well    Behavior During Therapy [facility] for tasks assessed/performed             Past Medical History:  Diagnosis Date   Coronary artery disease    Diabetes mellitus    Hyperlipidemia    Hypertension    Past Surgical History:  Procedure Laterality Date   CARDIAC CATHETERIZATION  2007   with stent placement   COLONOSCOPY N/A 11/28/2019   Procedure: COLONOSCOPY;  Surgeon: Corbin Ade, MD;  [facility];  Service: Endoscopy;  Laterality: N/A;  1:00   INGUINAL HERNIA REPAIR  11/15/2011   Procedure: HERNIA REPAIR INGUINAL ADULT;  Surgeon: Dalia Heading;  Location: [facility];  Service: General;  Laterality: Left;   INGUINAL HERNIA REPAIR Right 04/08/2021   Procedure: HERNIA REPAIR INGUINAL ADULT W/MESH;  Surgeon: Franky Macho, MD;  Location: [facility];  Service: General;  Laterality: Right;   MOUTH SURGERY     PARTIAL COLECTOMY N/A 12/12/2019   Procedure: PARTIAL COLECTOMY;  Surgeon: Franky Macho, MD;  Location: [facility];  Service: General;  Laterality: N/A;   POLYPECTOMY  11/28/2019   Procedure: POLYPECTOMY;  Surgeon: Corbin Ade, MD;  [facility];  Service: Endoscopy;;   ROTATOR CUFF REPAIR     left   Patient Active Problem List   Diagnosis Date Noted   Pain due to onychomycosis of toenails of both feet 03/09/2023   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   Right inguinal hernia    S/P partial colectomy 12/12/2019   Dysplastic polyp  of colon    Encounter for screening colonoscopy 08/06/2019   Essential hypertension 08/06/2019   Hyperlipidemia 08/06/2019   Diabetes mellitus without complication (HCC) 05/24/2009   CAD, UNSPECIFIED SITE 05/24/2009   DYSPNEA ON EXERTION 05/24/2009    ONSET DATE: Chronic  REFERRING DIAG: R26.89 (ICD-10-CM) - Other abnormalities of gait and mobility  THERAPY DIAG:  Other abnormalities of gait and mobility  Muscle weakness (generalized)  Unsteadiness on feet  Rationale for Evaluation and Treatment: Rehabilitation  SUBJECTIVE:  SUBJECTIVE STATEMENT: Pt states he's here for balance. Pt reports he is stumbling and wobbly. Reports multiple near falls. States he can get up and feel wobbly. Pt reports he has to use rail when going up/down steps. "Legs feel like lead" Pt accompanied by: self  PERTINENT HISTORY: n/a  PAIN:  Are you having pain? No  PRECAUTIONS: Fall  RED FLAGS: None   WEIGHT BEARING RESTRICTIONS: No  FALLS: Has patient fallen in last 6 months? No  LIVING ENVIRONMENT: Lives with: lives with their spouse Lives in: House/apartment Stairs: No Has following equipment at home: None  PLOF: Independent -- plays golf, mow grass (uses push mower), preaching  PATIENT GOALS: Improve balance  OBJECTIVE:   DIAGNOSTIC FINDINGS: n/a  COGNITION: Overall cognitive status: Within functional limits for tasks assessed   SENSATION: WFL  COORDINATION: WFL  EDEMA:  WNL  MUSCLE TONE: WNL  MUSCLE LENGTH: WNL  DTRs:  No issues  POSTURE: pelvis primarily rotated posteriorly in standing  LOWER EXTREMITY ROM:     Active  Right Eval Left Eval  Hip flexion    Hip extension    Hip abduction    Hip adduction    Hip internal rotation    Hip external rotation    Knee  flexion    Knee extension    Ankle dorsiflexion    Ankle plantarflexion    Ankle inversion    Ankle eversion     (Blank rows = not tested)  LOWER EXTREMITY MMT:    MMT Right Eval Left Eval  Hip flexion 4 4  Hip extension 2+ 2+  Hip abduction 3 3  Hip adduction    Hip internal rotation    Hip external rotation    Knee flexion 4- 4-  Knee extension 4+ 4+  Ankle dorsiflexion    Ankle plantarflexion 4+ 4+  Ankle inversion    Ankle eversion    (Blank rows = not tested)  BED MOBILITY:  Independent throughout but slow due to arm sore from COVID and flu shot  STAIRS: Level of Assistance: Modified independence Stair Negotiation Technique: Step to Pattern with Single Rail on Right Number of Stairs: 5  Height of Stairs: 6"  Comments: Slow, hip drop  GAIT: Gait pattern: shuffling, ataxic, lateral lean- Right, lateral lean- Left, and wide BOS Distance walked: In clinic Assistive device utilized: None Level of assistance: Complete Independence  FUNCTIONAL TESTS:  5x STS 11.68 sec Berg Balance Test: 44/56 DGI: 16/24  [facility] PT Assessment - 07/15/23 0001       Standardized Balance Assessment   Standardized Balance Assessment Berg Balance Test;Dynamic Gait Index      Berg Balance Test   Sit to Stand Able to stand without using hands and stabilize independently    Standing Unsupported Able to stand safely 2 minutes    Sitting with Back Unsupported but Feet Supported on Floor or Stool Able to sit safely and securely 2 minutes    Stand to Sit Sits safely with minimal use of hands    Transfers Able to transfer safely, minor use of hands    Standing Unsupported with Eyes Closed Able to stand 10 seconds safely   mild sway   Standing Unsupported with Feet Together Able to place feet together independently and stand 1 minute safely    From Standing, Reach Forward with Outstretched Arm Can reach forward >12 cm safely (5")    From Standing Position, Pick up Object from Floor Able to  pick up shoe safely  and easily    From Standing Position, Turn to Look Behind Over each Shoulder Needs supervision when turning   LOB when returning to face forward   Turn 360 Degrees Able to turn 360 degrees safely but slowly    Standing Unsupported, Alternately Place Feet on Step/Stool Able to stand independently and complete 8 steps >20 seconds    Standing Unsupported, One Foot in Front Able to take small step independently and hold 30 seconds   LOB on R LE at 7 sec   Standing on One Leg Tries to lift leg/unable to hold 3 seconds but remains standing independently    Total Score 44      Dynamic Gait Index   Level Surface Mild Impairment    Change in Gait Speed Mild Impairment    Gait with Horizontal Head Turns Normal    Gait with Vertical Head Turns Mild Impairment    Gait and Pivot Turn Mild Impairment    Step Over Obstacle Moderate Impairment    Step Around Obstacles Mild Impairment    Steps Mild Impairment    Total Score 16              PATIENT SURVEYS:  Did not assess  TODAY'S TREATMENT:                                                                                                                              DATE: 07/15/23 See HEP below    PATIENT EDUCATION: Education details: Exam findings, POC, initial HEP Person educated: Patient Education method: Explanation, Demonstration, and Handouts Education comprehension: verbalized understanding, returned demonstration, and needs further education  HOME EXERCISE PROGRAM: Access Code: [email or web address] URL: [email or web address] Date: 07/15/2023 Prepared by: Vernon Prey April Kirstie Peri  Exercises - Standing Hip Abduction with Counter Support  - 3 x daily - 7 x weekly - 1 sets - 10 reps - Standing Hip Extension with Counter Support  - 3 x daily - 7 x weekly - 1 sets - 10 reps - Standing Knee Flexion AROM with Chair Support  - 3 x daily - 7 x weekly - 1 sets - 10 reps - Standing Romberg to 1/2 Tandem Stance  - 3 x  daily - 7 x weekly - 1 sets - 30 sec hold  GOALS: Goals reviewed with patient? Yes  SHORT TERM GOALS: Target date: 08/12/2023   Pt will be ind with initial HEP Baseline: Goal status: INITIAL  2.  Pt will demo at least 3/5 hip extension for improving single leg stability Baseline:  Goal status: INITIAL    LONG TERM GOALS: Target date: 09/09/2023    Pt will be ind with progression and advancement of HEP Baseline:  Goal status: INITIAL  2.  Pt will have improved Berg Balance Score to >/=52/56 to demo MCID Baseline: 44/56 Goal status: INITIAL  3.  Pt will have improved DGI to >/=22/24 to demo decreased fall risk Baseline: 16/24 Goal status: INITIAL  4.  Pt will be able to perform 5 steps with reciprocal pattern with no handrail to demo increased hip/knee strength and stability Baseline:  Goal status: INITIAL  ASSESSMENT:  CLINICAL IMPRESSION: Patient is a 77 y.o. M who was seen today for physical therapy evaluation and treatment for gait instability. Assessment significant for very weak proximal LE strength bilat leading to decreased stability and balance for steps, ambulating on even and uneven surfaces limiting community and recreational tasks. Pt's DGI and Berg Balance both indicate a high fall risk. Pt will benefit from PT to address these deficits for safe mobility.   OBJECTIVE IMPAIRMENTS: Abnormal gait, decreased activity tolerance, decreased balance, decreased mobility, difficulty walking, decreased ROM, decreased strength, improper body mechanics, and postural dysfunction.   ACTIVITY LIMITATIONS: standing, squatting, stairs, transfers, and locomotion level  PARTICIPATION LIMITATIONS: community activity, yard work, and Tourist information centre manager  PERSONAL FACTORS: Age, Fitness, Past/current experiences, and Time since onset of injury/illness/exacerbation are also affecting patient's functional outcome.   REHAB POTENTIAL: Good  CLINICAL DECISION MAKING:  Stable/uncomplicated  EVALUATION COMPLEXITY: Low  PLAN:  PT FREQUENCY: 2x/week  PT DURATION: 8 weeks  PLANNED INTERVENTIONS: Therapeutic exercises, Therapeutic activity, Neuromuscular re-education, Balance training, Gait training, Patient/Family education, Self Care, Joint mobilization, Stair training, Aquatic Therapy, Dry Needling, Electrical stimulation, Spinal mobilization, Cryotherapy, Moist heat, Taping, Traction, Manual therapy, and Re-evaluation  PLAN FOR NEXT SESSION: Assess response to HEP. Continue hip strengthening, core strengthening and balance   Madison Direnzo April Ma L Haitham Dolinsky, PT 07/15/2023, 1:14 PM

## 2023-07-18 ENCOUNTER — Ambulatory Visit (HOSPITAL_COMMUNITY): Payer: Medicare HMO

## 2023-07-18 DIAGNOSIS — M6281 Muscle weakness (generalized): Secondary | ICD-10-CM

## 2023-07-18 DIAGNOSIS — R2681 Unsteadiness on feet: Secondary | ICD-10-CM

## 2023-07-18 DIAGNOSIS — R2689 Other abnormalities of gait and mobility: Secondary | ICD-10-CM

## 2023-07-18 NOTE — Therapy (Signed)
OUTPATIENT PHYSICAL THERAPY NEURO TREATMENT   Patient Name: Timothy Brennan MRN: [id number] DOB:22-Jun-1946, 77 y.o., male Today's Date: 07/18/2023   PCP: Benita Stabile, MD REFERRING PROVIDER: Benita Stabile, MD  END OF SESSION:  PT End of Session - 07/18/23 0731     Visit Number 2    Number of Visits 16    Date for PT Re-Evaluation 09/09/23    Authorization Type [employer]    Progress Note Due on Visit 10    PT Start Time 0731    PT Stop Time 0811    PT Time Calculation (min) 40 min    Activity Tolerance Patient tolerated treatment well    Behavior During Therapy [facility] for tasks assessed/performed             Past Medical History:  Diagnosis Date   Coronary artery disease    Diabetes mellitus    Hyperlipidemia    Hypertension    Past Surgical History:  Procedure Laterality Date   CARDIAC CATHETERIZATION  2007   with stent placement   COLONOSCOPY N/A 11/28/2019   Procedure: COLONOSCOPY;  Surgeon: Corbin Ade, MD;  [facility];  Service: Endoscopy;  Laterality: N/A;  1:00   INGUINAL HERNIA REPAIR  11/15/2011   Procedure: HERNIA REPAIR INGUINAL ADULT;  Surgeon: Dalia Heading;  Location: [facility];  Service: General;  Laterality: Left;   INGUINAL HERNIA REPAIR Right 04/08/2021   Procedure: HERNIA REPAIR INGUINAL ADULT W/MESH;  Surgeon: Franky Macho, MD;  Location: [facility];  Service: General;  Laterality: Right;   MOUTH SURGERY     PARTIAL COLECTOMY N/A 12/12/2019   Procedure: PARTIAL COLECTOMY;  Surgeon: Franky Macho, MD;  Location: [facility];  Service: General;  Laterality: N/A;   POLYPECTOMY  11/28/2019   Procedure: POLYPECTOMY;  Surgeon: Corbin Ade, MD;  [facility];  Service: Endoscopy;;   ROTATOR CUFF REPAIR     left   Patient Active Problem List   Diagnosis Date Noted   Pain due to onychomycosis of toenails of both feet 03/09/2023   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   Right inguinal hernia    S/P partial colectomy 12/12/2019   Dysplastic polyp  of colon    Encounter for screening colonoscopy 08/06/2019   Essential hypertension 08/06/2019   Hyperlipidemia 08/06/2019   Diabetes mellitus without complication (HCC) 05/24/2009   CAD, UNSPECIFIED SITE 05/24/2009   DYSPNEA ON EXERTION 05/24/2009    ONSET DATE: Chronic  REFERRING DIAG: R26.89 (ICD-10-CM) - Other abnormalities of gait and mobility  THERAPY DIAG:  Muscle weakness (generalized)  Unsteadiness on feet  Other abnormalities of gait and mobility  Rationale for Evaluation and Treatment: Rehabilitation  SUBJECTIVE:  SUBJECTIVE STATEMENT: Feeling good today; no falls since last visit; states that his feet burn sometimes.    EVAL:Pt states he's here for balance. Pt reports he is stumbling and wobbly. Reports multiple near falls. States he can get up and feel wobbly. Pt reports he has to use rail when going up/down steps. "Legs feel like lead" Pt accompanied by: self  PERTINENT HISTORY: n/a  PAIN:  Are you having pain? No  PRECAUTIONS: Fall  RED FLAGS: None   WEIGHT BEARING RESTRICTIONS: No  FALLS: Has patient fallen in last 6 months? No  LIVING ENVIRONMENT: Lives with: lives with their spouse Lives in: House/apartment Stairs: No Has following equipment at home: None  PLOF: Independent -- plays golf, mow grass (uses push mower), preaching  PATIENT GOALS: Improve balance  OBJECTIVE:   DIAGNOSTIC FINDINGS: n/a  COGNITION: Overall cognitive status: Within functional limits for tasks assessed   SENSATION: WFL  COORDINATION: WFL  EDEMA:  WNL  MUSCLE TONE: WNL  MUSCLE LENGTH: WNL  DTRs:  No issues  POSTURE: pelvis primarily rotated posteriorly in standing  LOWER EXTREMITY ROM:     Active  Right Eval Left Eval  Hip flexion    Hip extension    Hip  abduction    Hip adduction    Hip internal rotation    Hip external rotation    Knee flexion    Knee extension    Ankle dorsiflexion    Ankle plantarflexion    Ankle inversion    Ankle eversion     (Blank rows = not tested)  LOWER EXTREMITY MMT:    MMT Right Eval Left Eval  Hip flexion 4 4  Hip extension 2+ 2+  Hip abduction 3 3  Hip adduction    Hip internal rotation    Hip external rotation    Knee flexion 4- 4-  Knee extension 4+ 4+  Ankle dorsiflexion    Ankle plantarflexion 4+ 4+  Ankle inversion    Ankle eversion    (Blank rows = not tested)  BED MOBILITY:  Independent throughout but slow due to arm sore from COVID and flu shot  STAIRS: Level of Assistance: Modified independence Stair Negotiation Technique: Step to Pattern with Single Rail on Right Number of Stairs: 5  Height of Stairs: 6"  Comments: Slow, hip drop  GAIT: Gait pattern: shuffling, ataxic, lateral lean- Right, lateral lean- Left, and wide BOS Distance walked: In clinic Assistive device utilized: None Level of assistance: Complete Independence  FUNCTIONAL TESTS:  5x STS 11.68 sec Berg Balance Test: 40/98 DGI: 16/24     PATIENT SURVEYS:  Did not assess  TODAY'S TREATMENT:                                                                                                                              DATE:  07/18/23 Review of HEP and goals Standing: Hip abduction 2 x 10 Hip extension 2 x 10 Tandem stance 3  x 20" Marching alternating 2 x 10 with 1 UE assist Squats to chair 2 x 10  4" box Step ups x 10 4" box lateral step ups x 10     07/15/23 See HEP below    PATIENT EDUCATION: Education details: Exam findings, POC, initial HEP Person educated: Patient Education method: Explanation, Demonstration, and Handouts Education comprehension: verbalized understanding, returned demonstration, and needs further education  HOME EXERCISE PROGRAM: Access Code: [email or web address] URL:  [email or web address] Date: 07/15/2023 Prepared by: Vernon Prey April Kirstie Peri  Exercises - Standing Hip Abduction with Counter Support  - 3 x daily - 7 x weekly - 1 sets - 10 reps - Standing Hip Extension with Counter Support  - 3 x daily - 7 x weekly - 1 sets - 10 reps - Standing Knee Flexion AROM with Chair Support  - 3 x daily - 7 x weekly - 1 sets - 10 reps - Standing Romberg to 1/2 Tandem Stance  - 3 x daily - 7 x weekly - 1 sets - 30 sec hold  GOALS: Goals reviewed with patient? Yes  SHORT TERM GOALS: Target date: 08/12/2023   Pt will be ind with initial HEP Baseline: Goal status: in progress  2.  Pt will demo at least 3/5 hip extension for improving single leg stability Baseline:  Goal status: in progress    LONG TERM GOALS: Target date: 09/09/2023    Pt will be ind with progression and advancement of HEP Baseline:  Goal status: in progress  2.  Pt will have improved Berg Balance Score to >/=52/56 to demo MCID Baseline: 44/56 Goal status: in progress  3.  Pt will have improved DGI to >/=22/24 to demo decreased fall risk Baseline: 16/24 Goal status: in progress  4.  Pt will be able to perform 5 steps with reciprocal pattern with no handrail to demo increased hip/knee strength and stability Baseline:  Goal status: in progress  ASSESSMENT:  CLINICAL IMPRESSION: Today's session started with a review of HEP and goals; he verbalizes agreement with set rehab goals.  Continues with good challenge with tandem stance. Needs cues for maintaining good posture to avoid trunk substitution with hip extension and hip abduction. Needs cues to avoid heels coming up with squats.  Noted slight "foot slap" with step ups. Patient reports appropriate amount of moderate fatigue at the end of treatment today.   Patient will benefit from continued skilled therapy services  to address deficits and promote return to optimal function.       Eval:Patient is a 77 y.o. M who  was seen today for physical therapy evaluation and treatment for gait instability. Assessment significant for very weak proximal LE strength bilat leading to decreased stability and balance for steps, ambulating on even and uneven surfaces limiting community and recreational tasks. Pt's DGI and Berg Balance both indicate a high fall risk. Pt will benefit from PT to address these deficits for safe mobility.   OBJECTIVE IMPAIRMENTS: Abnormal gait, decreased activity tolerance, decreased balance, decreased mobility, difficulty walking, decreased ROM, decreased strength, improper body mechanics, and postural dysfunction.   ACTIVITY LIMITATIONS: standing, squatting, stairs, transfers, and locomotion level  PARTICIPATION LIMITATIONS: community activity, yard work, and Tourist information centre manager  PERSONAL FACTORS: Age, Fitness, Past/current experiences, and Time since onset of injury/illness/exacerbation are also affecting patient's functional outcome.   REHAB POTENTIAL: Good  CLINICAL DECISION MAKING: Stable/uncomplicated  EVALUATION COMPLEXITY: Low  PLAN:  PT FREQUENCY: 2x/week  PT DURATION: 8 weeks  PLANNED INTERVENTIONS: Therapeutic exercises, Therapeutic activity, Neuromuscular  re-education, Balance training, Gait training, Patient/Family education, Self Care, Joint mobilization, Stair training, Aquatic Therapy, Dry Needling, Electrical stimulation, Spinal mobilization, Cryotherapy, Moist heat, Taping, Traction, Manual therapy, and Re-evaluation  PLAN FOR NEXT SESSION: Continue hip strengthening, core strengthening and balance  7:33 AM, 07/18/23 Elishia Kaczorowski Small Terius Jacuinde MPT [place] [facility] [place] [phone] Ph:[phone]

## 2023-07-21 ENCOUNTER — Encounter (HOSPITAL_COMMUNITY): Payer: Self-pay | Admitting: Physical Therapy

## 2023-07-21 ENCOUNTER — Ambulatory Visit (HOSPITAL_COMMUNITY): Payer: Medicare HMO | Admitting: Physical Therapy

## 2023-07-21 DIAGNOSIS — R2689 Other abnormalities of gait and mobility: Secondary | ICD-10-CM | POA: Diagnosis not present

## 2023-07-21 DIAGNOSIS — R2681 Unsteadiness on feet: Secondary | ICD-10-CM

## 2023-07-21 DIAGNOSIS — M6281 Muscle weakness (generalized): Secondary | ICD-10-CM

## 2023-07-21 NOTE — Therapy (Signed)
OUTPATIENT PHYSICAL THERAPY NEURO TREATMENT   Patient Name: Timothy Brennan MRN: [id number] DOB:1946/07/12, 77 y.o., male Today's Date: 07/21/2023   PCP: Benita Stabile, MD REFERRING PROVIDER: Benita Stabile, MD  END OF SESSION:  PT End of Session - 07/21/23 0857     Visit Number 3    Number of Visits 16    Date for PT Re-Evaluation 09/09/23    Authorization Type [employer]    Progress Note Due on Visit 10    PT Start Time 0900    PT Stop Time 0940    PT Time Calculation (min) 40 min    Activity Tolerance Patient tolerated treatment well    Behavior During Therapy [facility] for tasks assessed/performed              Past Medical History:  Diagnosis Date   Coronary artery disease    Diabetes mellitus    Hyperlipidemia    Hypertension    Past Surgical History:  Procedure Laterality Date   CARDIAC CATHETERIZATION  2007   with stent placement   COLONOSCOPY N/A 11/28/2019   Procedure: COLONOSCOPY;  Surgeon: Corbin Ade, MD;  [facility];  Service: Endoscopy;  Laterality: N/A;  1:00   INGUINAL HERNIA REPAIR  11/15/2011   Procedure: HERNIA REPAIR INGUINAL ADULT;  Surgeon: Dalia Heading;  Location: [facility];  Service: General;  Laterality: Left;   INGUINAL HERNIA REPAIR Right 04/08/2021   Procedure: HERNIA REPAIR INGUINAL ADULT W/MESH;  Surgeon: Franky Macho, MD;  Location: [facility];  Service: General;  Laterality: Right;   MOUTH SURGERY     PARTIAL COLECTOMY N/A 12/12/2019   Procedure: PARTIAL COLECTOMY;  Surgeon: Franky Macho, MD;  Location: [facility];  Service: General;  Laterality: N/A;   POLYPECTOMY  11/28/2019   Procedure: POLYPECTOMY;  Surgeon: Corbin Ade, MD;  [facility];  Service: Endoscopy;;   ROTATOR CUFF REPAIR     left   Patient Active Problem List   Diagnosis Date Noted   Pain due to onychomycosis of toenails of both feet 03/09/2023   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   Right inguinal hernia    S/P partial colectomy 12/12/2019   Dysplastic  polyp of colon    Encounter for screening colonoscopy 08/06/2019   Essential hypertension 08/06/2019   Hyperlipidemia 08/06/2019   Diabetes mellitus without complication (HCC) 05/24/2009   CAD, UNSPECIFIED SITE 05/24/2009   DYSPNEA ON EXERTION 05/24/2009    ONSET DATE: Chronic  REFERRING DIAG: R26.89 (ICD-10-CM) - Other abnormalities of gait and mobility  THERAPY DIAG:  Muscle weakness (generalized)  Unsteadiness on feet  Other abnormalities of gait and mobility  Rationale for Evaluation and Treatment: Rehabilitation  SUBJECTIVE:  SUBJECTIVE STATEMENT: Pt states he's been doing his squatting exercise. No falls. A little sore.   EVAL:Pt states he's here for balance. Pt reports he is stumbling and wobbly. Reports multiple near falls. States he can get up and feel wobbly. Pt reports he has to use rail when going up/down steps. "Legs feel like lead" Pt accompanied by: self  PERTINENT HISTORY: n/a  PAIN:  Are you having pain? No  PRECAUTIONS: Fall  RED FLAGS: None   WEIGHT BEARING RESTRICTIONS: No  FALLS: Has patient fallen in last 6 months? No  LIVING ENVIRONMENT: Lives with: lives with their spouse Lives in: House/apartment Stairs: No Has following equipment at home: None  PLOF: Independent -- plays golf, mow grass (uses push mower), preaching  PATIENT GOALS: Improve balance  OBJECTIVE:   DIAGNOSTIC FINDINGS: n/a  MUSCLE LENGTH: WNL  DTRs:  No issues  POSTURE: pelvis primarily rotated posteriorly in standing  LOWER EXTREMITY ROM:     Active  Right Eval Left Eval  Hip flexion    Hip extension    Hip abduction    Hip adduction    Hip internal rotation    Hip external rotation    Knee flexion    Knee extension    Ankle dorsiflexion    Ankle plantarflexion     Ankle inversion    Ankle eversion     (Blank rows = not tested)  LOWER EXTREMITY MMT:    MMT Right Eval Left Eval  Hip flexion 4 4  Hip extension 2+ 2+  Hip abduction 3 3  Hip adduction    Hip internal rotation    Hip external rotation    Knee flexion 4- 4-  Knee extension 4+ 4+  Ankle dorsiflexion    Ankle plantarflexion 4+ 4+  Ankle inversion    Ankle eversion    (Blank rows = not tested)  BED MOBILITY:  Independent throughout but slow due to arm sore from COVID and flu shot  STAIRS: Level of Assistance: Modified independence Stair Negotiation Technique: Step to Pattern with Single Rail on Right Number of Stairs: 5  Height of Stairs: 6"  Comments: Slow, hip drop  GAIT: Gait pattern: shuffling, ataxic, lateral lean- Right, lateral lean- Left, and wide BOS Distance walked: In clinic Assistive device utilized: None Level of assistance: Complete Independence  FUNCTIONAL TESTS:  5x STS 11.68 sec Berg Balance Test: 44/56 DGI: 16/24   PATIENT SURVEYS:  Did not assess  TODAY'S TREATMENT:                                                                                                                              DATE:  07/21/23 Nustep L5 x 5 min UEs/LEs Hip abduction red TB around knees 2x10 Hip extension 2x10 Hip flexor stretch 2x30" without UE to also encourage balance Marching alternating around knees 2 x 10 without support Squats to chair 2 x 10  Tandem stance 3x30" Feet together head turns  x10 Feet together head nods x10 Feet together on foam x30" Feet together on foam alternating UE raises 2x10  07/18/23 Review of HEP and goals Standing: Hip abduction 2 x 10 Hip extension 2 x 10 Tandem stance 3 x 20" Marching alternating 2 x 10 with 1 UE assist Squats to chair 2 x 10  4" box Step ups x 10 4" box lateral step ups x 10   07/15/23 See HEP below    PATIENT EDUCATION: Education details: Exam findings, POC, initial HEP Person educated:  Patient Education method: Explanation, Demonstration, and Handouts Education comprehension: verbalized understanding, returned demonstration, and needs further education  HOME EXERCISE PROGRAM: Access Code: [email or web address] URL: [email or web address] Date: 07/15/2023 Prepared by: Vernon Prey April Kirstie Peri  Exercises - Standing Hip Abduction with Counter Support  - 3 x daily - 7 x weekly - 1 sets - 10 reps - Standing Hip Extension with Counter Support  - 3 x daily - 7 x weekly - 1 sets - 10 reps - Standing Knee Flexion AROM with Chair Support  - 3 x daily - 7 x weekly - 1 sets - 10 reps - Standing Romberg to 1/2 Tandem Stance  - 3 x daily - 7 x weekly - 1 sets - 30 sec hold  GOALS: Goals reviewed with patient? Yes  SHORT TERM GOALS: Target date: 08/12/2023   Pt will be ind with initial HEP Baseline: Goal status: in progress  2.  Pt will demo at least 3/5 hip extension for improving single leg stability Baseline:  Goal status: in progress    LONG TERM GOALS: Target date: 09/09/2023   Pt will be ind with progression and advancement of HEP Baseline:  Goal status: in progress  2.  Pt will have improved Berg Balance Score to >/=52/56 to demo MCID Baseline: 44/56 Goal status: in progress  3.  Pt will have improved DGI to >/=22/24 to demo decreased fall risk Baseline: 16/24 Goal status: in progress  4.  Pt will be able to perform 5 steps with reciprocal pattern with no handrail to demo increased hip/knee strength and stability Baseline:  Goal status: in progress  ASSESSMENT:  CLINICAL IMPRESSION: Progressed hip abduction strength by adding resistance. Unable to perform hip extension with resistance at this time without increased trunk compensation. Continued to work on balance with good pt tolerance. Tandem stance is improving with less LOB. Added foam for balance for increased challenge.  Eval:Patient is a 77 y.o. M who was seen today for physical therapy  evaluation and treatment for gait instability. Assessment significant for very weak proximal LE strength bilat leading to decreased stability and balance for steps, ambulating on even and uneven surfaces limiting community and recreational tasks. Pt's DGI and Berg Balance both indicate a high fall risk. Pt will benefit from PT to address these deficits for safe mobility.   OBJECTIVE IMPAIRMENTS: Abnormal gait, decreased activity tolerance, decreased balance, decreased mobility, difficulty walking, decreased ROM, decreased strength, improper body mechanics, and postural dysfunction.    PLAN:  PT FREQUENCY: 2x/week  PT DURATION: 8 weeks  PLANNED INTERVENTIONS: Therapeutic exercises, Therapeutic activity, Neuromuscular re-education, Balance training, Gait training, Patient/Family education, Self Care, Joint mobilization, Stair training, Aquatic Therapy, Dry Needling, Electrical stimulation, Spinal mobilization, Cryotherapy, Moist heat, Taping, Traction, Manual therapy, and Re-evaluation  PLAN FOR NEXT SESSION: Continue hip strengthening, core strengthening and balance  8:57 AM, 07/21/23 Henok Heacock April Ma Alphonsa Overall, PT, DPT [facility] [place] [phone]

## 2023-07-25 ENCOUNTER — Ambulatory Visit (HOSPITAL_COMMUNITY): Payer: Medicare HMO | Admitting: Physical Therapy

## 2023-07-25 DIAGNOSIS — M6281 Muscle weakness (generalized): Secondary | ICD-10-CM

## 2023-07-25 DIAGNOSIS — R2689 Other abnormalities of gait and mobility: Secondary | ICD-10-CM | POA: Diagnosis not present

## 2023-07-25 DIAGNOSIS — R2681 Unsteadiness on feet: Secondary | ICD-10-CM

## 2023-07-25 NOTE — Therapy (Signed)
OUTPATIENT PHYSICAL THERAPY NEURO TREATMENT   Patient Name: Timothy Brennan MRN: [id number] DOB:06-May-1946, 77 y.o., male Today's Date: 07/25/2023   PCP: Benita Stabile, MD REFERRING PROVIDER: Benita Stabile, MD  END OF SESSION:  PT End of Session - 07/25/23 0801     Visit Number 4    Number of Visits 16    Date for PT Re-Evaluation 09/09/23    Authorization Type [employer]    Progress Note Due on Visit 10    PT Start Time 0801    PT Stop Time 0845    PT Time Calculation (min) 44 min    Activity Tolerance Patient tolerated treatment well    Behavior During Therapy [facility] for tasks assessed/performed              Past Medical History:  Diagnosis Date   Coronary artery disease    Diabetes mellitus    Hyperlipidemia    Hypertension    Past Surgical History:  Procedure Laterality Date   CARDIAC CATHETERIZATION  2007   with stent placement   COLONOSCOPY N/A 11/28/2019   Procedure: COLONOSCOPY;  Surgeon: Corbin Ade, MD;  [facility];  Service: Endoscopy;  Laterality: N/A;  1:00   INGUINAL HERNIA REPAIR  11/15/2011   Procedure: HERNIA REPAIR INGUINAL ADULT;  Surgeon: Dalia Heading;  Location: [facility];  Service: General;  Laterality: Left;   INGUINAL HERNIA REPAIR Right 04/08/2021   Procedure: HERNIA REPAIR INGUINAL ADULT W/MESH;  Surgeon: Franky Macho, MD;  Location: [facility];  Service: General;  Laterality: Right;   MOUTH SURGERY     PARTIAL COLECTOMY N/A 12/12/2019   Procedure: PARTIAL COLECTOMY;  Surgeon: Franky Macho, MD;  Location: [facility];  Service: General;  Laterality: N/A;   POLYPECTOMY  11/28/2019   Procedure: POLYPECTOMY;  Surgeon: Corbin Ade, MD;  [facility];  Service: Endoscopy;;   ROTATOR CUFF REPAIR     left   Patient Active Problem List   Diagnosis Date Noted   Pain due to onychomycosis of toenails of both feet 03/09/2023   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   Right inguinal hernia    S/P partial colectomy 12/12/2019   Dysplastic  polyp of colon    Encounter for screening colonoscopy 08/06/2019   Essential hypertension 08/06/2019   Hyperlipidemia 08/06/2019   Diabetes mellitus without complication (HCC) 05/24/2009   CAD, UNSPECIFIED SITE 05/24/2009   DYSPNEA ON EXERTION 05/24/2009    ONSET DATE: Chronic  REFERRING DIAG: R26.89 (ICD-10-CM) - Other abnormalities of gait and mobility  THERAPY DIAG:  Muscle weakness (generalized)  Unsteadiness on feet  Other abnormalities of gait and mobility  Rationale for Evaluation and Treatment: Rehabilitation  SUBJECTIVE:  SUBJECTIVE STATEMENT: Pt states he's doing well without any issues.   EVAL:Pt states he's here for balance. Pt reports he is stumbling and wobbly. Reports multiple near falls. States he can get up and feel wobbly. Pt reports he has to use rail when going up/down steps. "Legs feel like lead" Pt accompanied by: self  PERTINENT HISTORY: n/a  PAIN:  Are you having pain? No  PRECAUTIONS: Fall  RED FLAGS: None   WEIGHT BEARING RESTRICTIONS: No  FALLS: Has patient fallen in last 6 months? No  LIVING ENVIRONMENT: Lives with: lives with their spouse Lives in: House/apartment Stairs: No Has following equipment at home: None  PLOF: Independent -- plays golf, mow grass (uses push mower), preaching  PATIENT GOALS: Improve balance  OBJECTIVE:   DIAGNOSTIC FINDINGS: n/a  MUSCLE LENGTH: WNL  DTRs:  No issues  POSTURE: pelvis primarily rotated posteriorly in standing  LOWER EXTREMITY ROM:     Active  Right Eval Left Eval  Hip flexion    Hip extension    Hip abduction    Hip adduction    Hip internal rotation    Hip external rotation    Knee flexion    Knee extension    Ankle dorsiflexion    Ankle plantarflexion    Ankle inversion    Ankle  eversion     (Blank rows = not tested)  LOWER EXTREMITY MMT:    MMT Right Eval Left Eval  Hip flexion 4 4  Hip extension 2+ 2+  Hip abduction 3 3  Hip adduction    Hip internal rotation    Hip external rotation    Knee flexion 4- 4-  Knee extension 4+ 4+  Ankle dorsiflexion    Ankle plantarflexion 4+ 4+  Ankle inversion    Ankle eversion    (Blank rows = not tested)  BED MOBILITY:  Independent throughout but slow due to arm sore from COVID and flu shot  STAIRS: Level of Assistance: Modified independence Stair Negotiation Technique: Step to Pattern with Single Rail on Right Number of Stairs: 5  Height of Stairs: 6"  Comments: Slow, hip drop  GAIT: Gait pattern: shuffling, ataxic, lateral lean- Right, lateral lean- Left, and wide BOS Distance walked: In clinic Assistive device utilized: None Level of assistance: Complete Independence  FUNCTIONAL TESTS:  5x STS 11.68 sec Berg Balance Test: 44/56 DGI: 16/24   PATIENT SURVEYS:  Did not assess  TODAY'S TREATMENT:                                                                                                                              DATE:  07/25/23 Hip abduction red TB around knees 2x10 Hip extension red TB 2x10 Marching alternating red TB 2 x 10 with 1 UE Squats to chair 2 x 10  Forward lunges onto 4" no UE for stabilization Tandem stance 3x30" Feet together head turns x10 Feet together head nods x10 Nustep L5 x 6  min LEs only ([place] seat 9  07/21/23 Nustep L5 x 5 min UEs/LEs Hip abduction red TB around knees 2x10 Hip extension 2x10 Hip flexor stretch 2x30" without UE to also encourage balance Marching alternating around knees 2 x 10 without support Squats to chair 2 x 10  Tandem stance 3x30" Feet together head turns x10 Feet together head nods x10 Feet together on foam x30" Feet together on foam alternating UE raises 2x10  07/18/23 Review of HEP and goals Standing: Hip abduction 2 x 10 Hip  extension 2 x 10 Tandem stance 3 x 20" Marching alternating 2 x 10 with 1 UE assist Squats to chair 2 x 10  4" box Step ups x 10 4" box lateral step ups x 10   07/15/23 See HEP below    PATIENT EDUCATION: Education details: Exam findings, POC, initial HEP Person educated: Patient Education method: Explanation, Demonstration, and Handouts Education comprehension: verbalized understanding, returned demonstration, and needs further education  HOME EXERCISE PROGRAM: Access Code: [email or web address] URL: [email or web address] Date: 07/15/2023 Prepared by: Vernon Prey April Kirstie Peri  Exercises - Standing Hip Abduction with Counter Support  - 3 x daily - 7 x weekly - 1 sets - 10 reps - Standing Hip Extension with Counter Support  - 3 x daily - 7 x weekly - 1 sets - 10 reps - Standing Knee Flexion AROM with Chair Support  - 3 x daily - 7 x weekly - 1 sets - 10 reps - Standing Romberg to 1/2 Tandem Stance  - 3 x daily - 7 x weekly - 1 sets - 30 sec hold  GOALS: Goals reviewed with patient? Yes  SHORT TERM GOALS: Target date: 08/12/2023   Pt will be ind with initial HEP Baseline: Goal status: in progress  2.  Pt will demo at least 3/5 hip extension for improving single leg stability Baseline:  Goal status: in progress    LONG TERM GOALS: Target date: 09/09/2023   Pt will be ind with progression and advancement of HEP Baseline:  Goal status: in progress  2.  Pt will have improved Berg Balance Score to >/=52/56 to demo MCID Baseline: 44/56 Goal status: in progress  3.  Pt will have improved DGI to >/=22/24 to demo decreased fall risk Baseline: 16/24 Goal status: in progress  4.  Pt will be able to perform 5 steps with reciprocal pattern with no handrail to demo increased hip/knee strength and stability Baseline:  Goal status: in progress  ASSESSMENT:  CLINICAL IMPRESSION: Continued with focus on improving LE strength and stability.  Pt able to complete hip  extension with resistance band this session. Began forward lunge with inability to complete without UE and difficulty keeping trunk upright.  Pt tends to keep shoulders rigid, bending trunk forward instead of mobilizing forward knee.  Continued to work on balance with noted challenge, cues for posturing.  Finished up with nustep for activity tolerance.  Pt reported fatigue in LE's at end of session.  Suggested using SPC until balance improves as sometimes staggers with gait.    Eval:Patient is a 77 y.o. M who was seen today for physical therapy evaluation and treatment for gait instability. Assessment significant for very weak proximal LE strength bilat leading to decreased stability and balance for steps, ambulating on even and uneven surfaces limiting community and recreational tasks. Pt's DGI and Berg Balance both indicate a high fall risk. Pt will benefit from PT to address these deficits for safe mobility.   OBJECTIVE IMPAIRMENTS:  Abnormal gait, decreased activity tolerance, decreased balance, decreased mobility, difficulty walking, decreased ROM, decreased strength, improper body mechanics, and postural dysfunction.    PLAN:  PT FREQUENCY: 2x/week  PT DURATION: 8 weeks  PLANNED INTERVENTIONS: Therapeutic exercises, Therapeutic activity, Neuromuscular re-education, Balance training, Gait training, Patient/Family education, Self Care, Joint mobilization, Stair training, Aquatic Therapy, Dry Needling, Electrical stimulation, Spinal mobilization, Cryotherapy, Moist heat, Taping, Traction, Manual therapy, and Re-evaluation  PLAN FOR NEXT SESSION: Continue hip strengthening, core strengthening and balance    8:52 AM, 07/25/23 [place] Levels, Isa Kohlenberg B, PTA

## 2023-07-27 ENCOUNTER — Encounter (HOSPITAL_COMMUNITY): Payer: Self-pay | Admitting: Physical Therapy

## 2023-07-27 ENCOUNTER — Ambulatory Visit (HOSPITAL_COMMUNITY): Payer: Medicare HMO | Admitting: Physical Therapy

## 2023-07-27 DIAGNOSIS — M6281 Muscle weakness (generalized): Secondary | ICD-10-CM

## 2023-07-27 DIAGNOSIS — R2689 Other abnormalities of gait and mobility: Secondary | ICD-10-CM

## 2023-07-27 DIAGNOSIS — R2681 Unsteadiness on feet: Secondary | ICD-10-CM

## 2023-07-27 NOTE — Therapy (Signed)
OUTPATIENT PHYSICAL THERAPY NEURO TREATMENT   Patient Name: Timothy Brennan MRN: [id number] DOB:[birth year], 77 y.o., male Today's Date: 07/27/2023   PCP: Benita Stabile, MD REFERRING PROVIDER: Benita Stabile, MD  END OF SESSION:  PT End of Session - 07/27/23 0758     Visit Number 5    Number of Visits 16    Date for PT Re-Evaluation 09/09/23    Authorization Type [employer]    Progress Note Due on Visit 10    PT Start Time 0800    PT Stop Time 0840    PT Time Calculation (min) 40 min    Activity Tolerance Patient tolerated treatment well    Behavior During Therapy [facility] for tasks assessed/performed              Past Medical History:  Diagnosis Date   Coronary artery disease    Diabetes mellitus    Hyperlipidemia    Hypertension    Past Surgical History:  Procedure Laterality Date   CARDIAC CATHETERIZATION  2007   with stent placement   COLONOSCOPY N/A 11/28/2019   Procedure: COLONOSCOPY;  Surgeon: Corbin Ade, MD;  [facility];  Service: Endoscopy;  Laterality: N/A;  1:00   INGUINAL HERNIA REPAIR  11/15/2011   Procedure: HERNIA REPAIR INGUINAL ADULT;  Surgeon: Dalia Heading;  Location: [facility];  Service: General;  Laterality: Left;   INGUINAL HERNIA REPAIR Right 04/08/2021   Procedure: HERNIA REPAIR INGUINAL ADULT W/MESH;  Surgeon: Franky Macho, MD;  Location: [facility];  Service: General;  Laterality: Right;   MOUTH SURGERY     PARTIAL COLECTOMY N/A 12/12/2019   Procedure: PARTIAL COLECTOMY;  Surgeon: Franky Macho, MD;  Location: [facility];  Service: General;  Laterality: N/A;   POLYPECTOMY  11/28/2019   Procedure: POLYPECTOMY;  Surgeon: Corbin Ade, MD;  [facility];  Service: Endoscopy;;   ROTATOR CUFF REPAIR     left   Patient Active Problem List   Diagnosis Date Noted   Pain due to onychomycosis of toenails of both feet 03/09/2023   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   Right inguinal hernia    S/P partial colectomy 12/12/2019   Dysplastic  polyp of colon    Encounter for screening colonoscopy 08/06/2019   Essential hypertension 08/06/2019   Hyperlipidemia 08/06/2019   Diabetes mellitus without complication (HCC) 05/24/2009   CAD, UNSPECIFIED SITE 05/24/2009   DYSPNEA ON EXERTION 05/24/2009    ONSET DATE: Chronic  REFERRING DIAG: R26.89 (ICD-10-CM) - Other abnormalities of gait and mobility  THERAPY DIAG:  Muscle weakness (generalized)  Unsteadiness on feet  Other abnormalities of gait and mobility  Rationale for Evaluation and Treatment: Rehabilitation  SUBJECTIVE:  SUBJECTIVE STATEMENT: Pt states he had very little sleep last night -- a member in his church passed. He states he is otherwise doing well this morning.   EVAL:Pt states he's here for balance. Pt reports he is stumbling and wobbly. Reports multiple near falls. States he can get up and feel wobbly. Pt reports he has to use rail when going up/down steps. "Legs feel like lead" Pt accompanied by: self  PERTINENT HISTORY: n/a  PAIN:  Are you having pain? No  PRECAUTIONS: Fall  RED FLAGS: None   WEIGHT BEARING RESTRICTIONS: No  FALLS: Has patient fallen in last 6 months? No  LIVING ENVIRONMENT: Lives with: lives with their spouse Lives in: House/apartment Stairs: No Has following equipment at home: None  PLOF: Independent -- plays golf, mow grass (uses push mower), preaching  PATIENT GOALS: Improve balance  OBJECTIVE:   DIAGNOSTIC FINDINGS: n/a  MUSCLE LENGTH: WNL  DTRs:  No issues  POSTURE: pelvis primarily rotated posteriorly in standing  LOWER EXTREMITY ROM:     Active  Right Eval Left Eval  Hip flexion    Hip extension    Hip abduction    Hip adduction    Hip internal rotation    Hip external rotation    Knee flexion    Knee  extension    Ankle dorsiflexion    Ankle plantarflexion    Ankle inversion    Ankle eversion     (Blank rows = not tested)  LOWER EXTREMITY MMT:    MMT Right Eval Left Eval  Hip flexion 4 4  Hip extension 2+ 2+  Hip abduction 3 3  Hip adduction    Hip internal rotation    Hip external rotation    Knee flexion 4- 4-  Knee extension 4+ 4+  Ankle dorsiflexion    Ankle plantarflexion 4+ 4+  Ankle inversion    Ankle eversion    (Blank rows = not tested)  BED MOBILITY:  Independent throughout but slow due to arm sore from COVID and flu shot  STAIRS: Level of Assistance: Modified independence Stair Negotiation Technique: Step to Pattern with Single Rail on Right Number of Stairs: 5  Height of Stairs: 6"  Comments: Slow, hip drop  GAIT: Gait pattern: shuffling, ataxic, lateral lean- Right, lateral lean- Left, and wide BOS Distance walked: In clinic Assistive device utilized: None Level of assistance: Complete Independence  FUNCTIONAL TESTS:  5x STS 11.68 sec Berg Balance Test: 16/10 DGI: 16/24   PATIENT SURVEYS:  Did not assess  TODAY'S TREATMENT:                                                                                                                              DATE:  07/27/23 Nustep L5 x 5 min LEs only Sidelying hip abd 2x10 Prone hip ext knee flexed 2x10 Supine SLR 2x10 Supine bridge 2x10 Squat to chair 2x10 Forward lunge on 6" step no UE 2x10 On foam  feet together eyes open 2x30" On foam feet together head turns 2x10 On foam feet together head nods 2x10 On foam slow marching 2x10 with single UE support On foam heel raise 2x10   07/25/23 Hip abduction red TB around knees 2x10 Hip extension red TB 2x10 Marching alternating red TB 2 x 10 with 1 UE Squats to chair 2 x 10  Forward lunges onto 4" no UE for stabilization Tandem stance 3x30" Feet together head turns x10 Feet together head nods x10 Nustep L5 x 6 min LEs only ([place]) seat  9  07/21/23 Nustep L5 x 5 min UEs/LEs Hip abduction red TB around knees 2x10 Hip extension 2x10 Hip flexor stretch 2x30" without UE to also encourage balance Marching alternating around knees 2 x 10 without support Squats to chair 2 x 10  Tandem stance 3x30" Feet together head turns x10 Feet together head nods x10 Feet together on foam x30" Feet together on foam alternating UE raises 2x10  07/18/23 Review of HEP and goals Standing: Hip abduction 2 x 10 Hip extension 2 x 10 Tandem stance 3 x 20" Marching alternating 2 x 10 with 1 UE assist Squats to chair 2 x 10  4" box Step ups x 10 4" box lateral step ups x 10   07/15/23 See HEP below    PATIENT EDUCATION: Education details: Exam findings, POC, initial HEP Person educated: Patient Education method: Explanation, Demonstration, and Handouts Education comprehension: verbalized understanding, returned demonstration, and needs further education  HOME EXERCISE PROGRAM: Access Code: [email or web address] URL: [email or web address] Date: 07/15/2023 Prepared by: Vernon Prey April Kirstie Peri  Exercises - Standing Hip Abduction with Counter Support  - 3 x daily - 7 x weekly - 1 sets - 10 reps - Standing Hip Extension with Counter Support  - 3 x daily - 7 x weekly - 1 sets - 10 reps - Standing Knee Flexion AROM with Chair Support  - 3 x daily - 7 x weekly - 1 sets - 10 reps - Standing Romberg to 1/2 Tandem Stance  - 3 x daily - 7 x weekly - 1 sets - 30 sec hold  GOALS: Goals reviewed with patient? Yes  SHORT TERM GOALS: Target date: 08/12/2023   Pt will be ind with initial HEP Baseline: Goal status: in progress  2.  Pt will demo at least 3/5 hip extension for improving single leg stability Baseline:  Goal status: in progress    LONG TERM GOALS: Target date: 09/09/2023   Pt will be ind with progression and advancement of HEP Baseline:  Goal status: in progress  2.  Pt will have improved Berg Balance Score to  >/=52/56 to demo MCID Baseline: 44/56 Goal status: in progress  3.  Pt will have improved DGI to >/=22/24 to demo decreased fall risk Baseline: 16/24 Goal status: in progress  4.  Pt will be able to perform 5 steps with reciprocal pattern with no handrail to demo increased hip/knee strength and stability Baseline:  Goal status: in progress  ASSESSMENT:  CLINICAL IMPRESSION: Continued hip and core strengthening. Difficulty maintaining level hips when performing bridge today; however, pt is demonstrating increasing strength against gravity with hip extension and hip abduction. Worked on progressing balance exercises to foam and stepping up on to higher surfaces.   Eval:Patient is a 77 y.o. M who was seen today for physical therapy evaluation and treatment for gait instability. Assessment significant for very weak proximal LE strength bilat leading to decreased stability and balance for steps,  ambulating on even and uneven surfaces limiting community and recreational tasks. Pt's DGI and Berg Balance both indicate a high fall risk. Pt will benefit from PT to address these deficits for safe mobility.   OBJECTIVE IMPAIRMENTS: Abnormal gait, decreased activity tolerance, decreased balance, decreased mobility, difficulty walking, decreased ROM, decreased strength, improper body mechanics, and postural dysfunction.    PLAN:  PT FREQUENCY: 2x/week  PT DURATION: 8 weeks  PLANNED INTERVENTIONS: Therapeutic exercises, Therapeutic activity, Neuromuscular re-education, Balance training, Gait training, Patient/Family education, Self Care, Joint mobilization, Stair training, Aquatic Therapy, Dry Needling, Electrical stimulation, Spinal mobilization, Cryotherapy, Moist heat, Taping, Traction, Manual therapy, and Re-evaluation  PLAN FOR NEXT SESSION: Continue hip strengthening, core strengthening and balance    7:58 AM, 07/27/23 Seini Lannom April Ma Alphonsa Overall, PT

## 2023-08-01 ENCOUNTER — Ambulatory Visit (HOSPITAL_COMMUNITY): Payer: Medicare HMO | Admitting: Physical Therapy

## 2023-08-01 ENCOUNTER — Encounter (HOSPITAL_COMMUNITY): Payer: Self-pay | Admitting: Physical Therapy

## 2023-08-01 DIAGNOSIS — R2681 Unsteadiness on feet: Secondary | ICD-10-CM

## 2023-08-01 DIAGNOSIS — R2689 Other abnormalities of gait and mobility: Secondary | ICD-10-CM | POA: Diagnosis not present

## 2023-08-01 DIAGNOSIS — M6281 Muscle weakness (generalized): Secondary | ICD-10-CM

## 2023-08-01 NOTE — Therapy (Signed)
OUTPATIENT PHYSICAL THERAPY NEURO TREATMENT   Patient Name: Timothy Brennan MRN: [id number] DOB:05-30-1946, 77 y.o., male Today's Date: 08/01/2023   PCP: Benita Stabile, MD REFERRING PROVIDER: Benita Stabile, MD  END OF SESSION:  PT End of Session - 08/01/23 0849     Visit Number 6    Number of Visits 16    Date for PT Re-Evaluation 09/09/23    Authorization Type [employer]    Progress Note Due on Visit 10    PT Start Time 0850    PT Stop Time 0930    PT Time Calculation (min) 40 min    Activity Tolerance Patient tolerated treatment well    Behavior During Therapy [facility] for tasks assessed/performed               Past Medical History:  Diagnosis Date   Coronary artery disease    Diabetes mellitus    Hyperlipidemia    Hypertension    Past Surgical History:  Procedure Laterality Date   CARDIAC CATHETERIZATION  2007   with stent placement   COLONOSCOPY N/A 11/28/2019   Procedure: COLONOSCOPY;  Surgeon: Corbin Ade, MD;  [facility];  Service: Endoscopy;  Laterality: N/A;  1:00   INGUINAL HERNIA REPAIR  11/15/2011   Procedure: HERNIA REPAIR INGUINAL ADULT;  Surgeon: Dalia Heading;  Location: [facility];  Service: General;  Laterality: Left;   INGUINAL HERNIA REPAIR Right 04/08/2021   Procedure: HERNIA REPAIR INGUINAL ADULT W/MESH;  Surgeon: Franky Macho, MD;  Location: [facility];  Service: General;  Laterality: Right;   MOUTH SURGERY     PARTIAL COLECTOMY N/A 12/12/2019   Procedure: PARTIAL COLECTOMY;  Surgeon: Franky Macho, MD;  Location: [facility];  Service: General;  Laterality: N/A;   POLYPECTOMY  11/28/2019   Procedure: POLYPECTOMY;  Surgeon: Corbin Ade, MD;  [facility];  Service: Endoscopy;;   ROTATOR CUFF REPAIR     left   Patient Active Problem List   Diagnosis Date Noted   Pain due to onychomycosis of toenails of both feet 03/09/2023   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   Right inguinal hernia    S/P partial colectomy 12/12/2019   Dysplastic  polyp of colon    Encounter for screening colonoscopy 08/06/2019   Essential hypertension 08/06/2019   Hyperlipidemia 08/06/2019   Diabetes mellitus without complication (HCC) 05/24/2009   CAD, UNSPECIFIED SITE 05/24/2009   DYSPNEA ON EXERTION 05/24/2009    ONSET DATE: Chronic  REFERRING DIAG: R26.89 (ICD-10-CM) - Other abnormalities of gait and mobility  THERAPY DIAG:  Muscle weakness (generalized)  Unsteadiness on feet  Other abnormalities of gait and mobility  Rationale for Evaluation and Treatment: Rehabilitation  SUBJECTIVE:  SUBJECTIVE STATEMENT: Pt states he played golf on Friday and felt a little unbalanced on the uneven ground.   EVAL:Pt states he's here for balance. Pt reports he is stumbling and wobbly. Reports multiple near falls. States he can get up and feel wobbly. Pt reports he has to use rail when going up/down steps. "Legs feel like lead" Pt accompanied by: self  PERTINENT HISTORY: n/a  PAIN:  Are you having pain? No  PRECAUTIONS: Fall  RED FLAGS: None   WEIGHT BEARING RESTRICTIONS: No  FALLS: Has patient fallen in last 6 months? No  LIVING ENVIRONMENT: Lives with: lives with their spouse Lives in: House/apartment Stairs: No Has following equipment at home: None  PLOF: Independent -- plays golf, mow grass (uses push mower), preaching  PATIENT GOALS: Improve balance  OBJECTIVE:   DIAGNOSTIC FINDINGS: n/a  MUSCLE LENGTH: WNL  DTRs:  No issues  POSTURE: pelvis primarily rotated posteriorly in standing  LOWER EXTREMITY ROM:     Active  Right Eval Left Eval  Hip flexion    Hip extension    Hip abduction    Hip adduction    Hip internal rotation    Hip external rotation    Knee flexion    Knee extension    Ankle dorsiflexion    Ankle  plantarflexion    Ankle inversion    Ankle eversion     (Blank rows = not tested)  LOWER EXTREMITY MMT:    MMT Right Eval Left Eval  Hip flexion 4 4  Hip extension 2+ 2+  Hip abduction 3 3  Hip adduction    Hip internal rotation    Hip external rotation    Knee flexion 4- 4-  Knee extension 4+ 4+  Ankle dorsiflexion    Ankle plantarflexion 4+ 4+  Ankle inversion    Ankle eversion    (Blank rows = not tested)  BED MOBILITY:  Independent throughout but slow due to arm sore from COVID and flu shot  STAIRS: Level of Assistance: Modified independence Stair Negotiation Technique: Step to Pattern with Single Rail on Right Number of Stairs: 5  Height of Stairs: 6"  Comments: Slow, hip drop  GAIT: Gait pattern: shuffling, ataxic, lateral lean- Right, lateral lean- Left, and wide BOS Distance walked: In clinic Assistive device utilized: None Level of assistance: Complete Independence  FUNCTIONAL TESTS:  5x STS 11.68 sec Berg Balance Test: 44/56 DGI: 16/24   PATIENT SURVEYS:  Did not assess  TODAY'S TREATMENT:                                                                                                                              DATE:  08/01/23 Nustep L6 x 5 min UEs/LEs Runner's step up 6" step 2x10 with 1 UE support Side step 6" step 2x10 with intermittent 1 UE support On foam  Staggered stance press forward with 3# straight weight 2x10 R&L  Staggered stance head turns R&L 2x10  Slow marching with 3# straight weight 2x10  One foot on foam, other foot on 6" step diagonals with 3# straight weight 2x10    07/27/23 Nustep L5 x 5 min LEs only Sidelying hip abd 2x10 Prone hip ext knee flexed 2x10 Supine SLR 2x10 Supine bridge 2x10 Squat to chair 2x10 Forward lunge on 6" step no UE 2x10 On foam feet together eyes open 2x30" On foam feet together head turns 2x10 On foam feet together head nods 2x10 On foam slow marching 2x10 with single UE support On foam  heel raise 2x10   07/25/23 Hip abduction red TB around knees 2x10 Hip extension red TB 2x10 Marching alternating red TB 2 x 10 with 1 UE Squats to chair 2 x 10  Forward lunges onto 4" no UE for stabilization Tandem stance 3x30" Feet together head turns x10 Feet together head nods x10 Nustep L5 x 6 min LEs only ([place]) seat 9  07/21/23 Nustep L5 x 5 min UEs/LEs Hip abduction red TB around knees 2x10 Hip extension 2x10 Hip flexor stretch 2x30" without UE to also encourage balance Marching alternating around knees 2 x 10 without support Squats to chair 2 x 10  Tandem stance 3x30" Feet together head turns x10 Feet together head nods x10 Feet together on foam x30" Feet together on foam alternating UE raises 2x10  07/18/23 Review of HEP and goals Standing: Hip abduction 2 x 10 Hip extension 2 x 10 Tandem stance 3 x 20" Marching alternating 2 x 10 with 1 UE assist Squats to chair 2 x 10  4" box Step ups x 10 4" box lateral step ups x 10   07/15/23 See HEP below    PATIENT EDUCATION: Education details: Exam findings, POC, initial HEP Person educated: Patient Education method: Explanation, Demonstration, and Handouts Education comprehension: verbalized understanding, returned demonstration, and needs further education  HOME EXERCISE PROGRAM: Access Code: [email or web address] URL: [email or web address] Date: 07/15/2023 Prepared by: Vernon Prey April Kirstie Peri  Exercises - Standing Hip Abduction with Counter Support  - 3 x daily - 7 x weekly - 1 sets - 10 reps - Standing Hip Extension with Counter Support  - 3 x daily - 7 x weekly - 1 sets - 10 reps - Standing Knee Flexion AROM with Chair Support  - 3 x daily - 7 x weekly - 1 sets - 10 reps - Standing Romberg to 1/2 Tandem Stance  - 3 x daily - 7 x weekly - 1 sets - 30 sec hold  GOALS: Goals reviewed with patient? Yes  SHORT TERM GOALS: Target date: 08/12/2023   Pt will be ind with initial HEP Baseline: Goal  status: in progress  2.  Pt will demo at least 3/5 hip extension for improving single leg stability Baseline:  Goal status: in progress    LONG TERM GOALS: Target date: 09/09/2023   Pt will be ind with progression and advancement of HEP Baseline:  Goal status: in progress  2.  Pt will have improved Berg Balance Score to >/=52/56 to demo MCID Baseline: 44/56 Goal status: in progress  3.  Pt will have improved DGI to >/=22/24 to demo decreased fall risk Baseline: 16/24 Goal status: in progress  4.  Pt will be able to perform 5 steps with reciprocal pattern with no handrail to demo increased hip/knee strength and stability Baseline:  Goal status: in progress  ASSESSMENT:  CLINICAL IMPRESSION: Continued progressing bilat LE strengthening and stability. Primarily working on foam for balance now with  good pt tolerance. Challenged with staggered stance -- tends to keep weight too far forward.   Eval:Patient is a 77 y.o. M who was seen today for physical therapy evaluation and treatment for gait instability. Assessment significant for very weak proximal LE strength bilat leading to decreased stability and balance for steps, ambulating on even and uneven surfaces limiting community and recreational tasks. Pt's DGI and Berg Balance both indicate a high fall risk. Pt will benefit from PT to address these deficits for safe mobility.   OBJECTIVE IMPAIRMENTS: Abnormal gait, decreased activity tolerance, decreased balance, decreased mobility, difficulty walking, decreased ROM, decreased strength, improper body mechanics, and postural dysfunction.    PLAN:  PT FREQUENCY: 2x/week  PT DURATION: 8 weeks  PLANNED INTERVENTIONS: Therapeutic exercises, Therapeutic activity, Neuromuscular re-education, Balance training, Gait training, Patient/Family education, Self Care, Joint mobilization, Stair training, Aquatic Therapy, Dry Needling, Electrical stimulation, Spinal mobilization, Cryotherapy,  Moist heat, Taping, Traction, Manual therapy, and Re-evaluation  PLAN FOR NEXT SESSION: Continue hip strengthening, core strengthening and balance    8:50 AM, 08/01/23 Joenathan Sakuma April Ma Alphonsa Overall, PT

## 2023-08-03 ENCOUNTER — Encounter (HOSPITAL_COMMUNITY): Payer: Medicare HMO | Admitting: Physical Therapy

## 2023-08-03 ENCOUNTER — Telehealth (HOSPITAL_COMMUNITY): Payer: Self-pay | Admitting: Physical Therapy

## 2023-08-03 NOTE — Therapy (Deleted)
OUTPATIENT PHYSICAL THERAPY NEURO TREATMENT   Patient Name: Timothy Brennan MRN: [id number] DOB:02/08/1946, 77 y.o., male Today's Date: 08/03/2023   PCP: Benita Stabile, MD REFERRING PROVIDER: Benita Stabile, MD  END OF SESSION:      Past Medical History:  Diagnosis Date   Coronary artery disease    Diabetes mellitus    Hyperlipidemia    Hypertension    Past Surgical History:  Procedure Laterality Date   CARDIAC CATHETERIZATION  2007   with stent placement   COLONOSCOPY N/A 11/28/2019   Procedure: COLONOSCOPY;  Surgeon: Corbin Ade, MD;  [facility];  Service: Endoscopy;  Laterality: N/A;  1:00   INGUINAL HERNIA REPAIR  11/15/2011   Procedure: HERNIA REPAIR INGUINAL ADULT;  Surgeon: Dalia Heading;  Location: [facility];  Service: General;  Laterality: Left;   INGUINAL HERNIA REPAIR Right 04/08/2021   Procedure: HERNIA REPAIR INGUINAL ADULT W/MESH;  Surgeon: Franky Macho, MD;  Location: [facility];  Service: General;  Laterality: Right;   MOUTH SURGERY     PARTIAL COLECTOMY N/A 12/12/2019   Procedure: PARTIAL COLECTOMY;  Surgeon: Franky Macho, MD;  Location: [facility];  Service: General;  Laterality: N/A;   POLYPECTOMY  11/28/2019   Procedure: POLYPECTOMY;  Surgeon: Corbin Ade, MD;  [facility];  Service: Endoscopy;;   ROTATOR CUFF REPAIR     left   Patient Active Problem List   Diagnosis Date Noted   Pain due to onychomycosis of toenails of both feet 03/09/2023   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   Right inguinal hernia    S/P partial colectomy 12/12/2019   Dysplastic polyp of colon    Encounter for screening colonoscopy 08/06/2019   Essential hypertension 08/06/2019   Hyperlipidemia 08/06/2019   Diabetes mellitus without complication (HCC) 05/24/2009   CAD, UNSPECIFIED SITE 05/24/2009   DYSPNEA ON EXERTION 05/24/2009    ONSET DATE: Chronic  REFERRING DIAG: R26.89 (ICD-10-CM) - Other abnormalities of gait and mobility  THERAPY DIAG:  No diagnosis  found.  Rationale for Evaluation and Treatment: Rehabilitation  SUBJECTIVE:                                                                                                                                                                                             SUBJECTIVE STATEMENT: *** Pt states he played golf on Friday and felt a little unbalanced on the uneven ground.   EVAL:Pt states he's here for balance. Pt reports he is stumbling and wobbly. Reports multiple near falls. States he can get up and feel wobbly. Pt reports he has to use rail when going  up/down steps. "Legs feel like lead" Pt accompanied by: self  PERTINENT HISTORY: n/a  PAIN:  Are you having pain? No  PRECAUTIONS: Fall  RED FLAGS: None   WEIGHT BEARING RESTRICTIONS: No  FALLS: Has patient fallen in last 6 months? No  LIVING ENVIRONMENT: Lives with: lives with their spouse Lives in: House/apartment Stairs: No Has following equipment at home: None  PLOF: Independent -- plays golf, mow grass (uses push mower), preaching  PATIENT GOALS: Improve balance  OBJECTIVE:   DIAGNOSTIC FINDINGS: n/a  MUSCLE LENGTH: WNL  DTRs:  No issues  POSTURE: pelvis primarily rotated posteriorly in standing  LOWER EXTREMITY ROM:     Active  Right Eval Left Eval  Hip flexion    Hip extension    Hip abduction    Hip adduction    Hip internal rotation    Hip external rotation    Knee flexion    Knee extension    Ankle dorsiflexion    Ankle plantarflexion    Ankle inversion    Ankle eversion     (Blank rows = not tested)  LOWER EXTREMITY MMT:    MMT Right Eval Left Eval  Hip flexion 4 4  Hip extension 2+ 2+  Hip abduction 3 3  Hip adduction    Hip internal rotation    Hip external rotation    Knee flexion 4- 4-  Knee extension 4+ 4+  Ankle dorsiflexion    Ankle plantarflexion 4+ 4+  Ankle inversion    Ankle eversion    (Blank rows = not tested)  BED MOBILITY:  Independent throughout but  slow due to arm sore from COVID and flu shot  STAIRS: Level of Assistance: Modified independence Stair Negotiation Technique: Step to Pattern with Single Rail on Right Number of Stairs: 5  Height of Stairs: 6"  Comments: Slow, hip drop  GAIT: Gait pattern: shuffling, ataxic, lateral lean- Right, lateral lean- Left, and wide BOS Distance walked: In clinic Assistive device utilized: None Level of assistance: Complete Independence  FUNCTIONAL TESTS:  5x STS 11.68 sec Berg Balance Test: 44/56 DGI: 16/24   PATIENT SURVEYS:  Did not assess  TODAY'S TREATMENT:                                                                                                                              DATE:  08/01/23 Nustep L6 x 5 min UEs/LEs Runner's step up 6" step 2x10 with 1 UE support Side step 6" step 2x10 with intermittent 1 UE support On foam  Staggered stance press forward with 3# straight weight 2x10 R&L  Staggered stance head turns R&L 2x10  Slow marching with 3# straight weight 2x10  One foot on foam, other foot on 6" step diagonals with 3# straight weight 2x10    07/27/23 Nustep L5 x 5 min LEs only Sidelying hip abd 2x10 Prone hip ext knee flexed 2x10 Supine SLR 2x10 Supine bridge 2x10 Squat to chair  2x10 Forward lunge on 6" step no UE 2x10 On foam feet together eyes open 2x30" On foam feet together head turns 2x10 On foam feet together head nods 2x10 On foam slow marching 2x10 with single UE support On foam heel raise 2x10   07/25/23 Hip abduction red TB around knees 2x10 Hip extension red TB 2x10 Marching alternating red TB 2 x 10 with 1 UE Squats to chair 2 x 10  Forward lunges onto 4" no UE for stabilization Tandem stance 3x30" Feet together head turns x10 Feet together head nods x10 Nustep L5 x 6 min LEs only ([place]) seat 9  07/21/23 Nustep L5 x 5 min UEs/LEs Hip abduction red TB around knees 2x10 Hip extension 2x10 Hip flexor stretch 2x30" without UE to  also encourage balance Marching alternating around knees 2 x 10 without support Squats to chair 2 x 10  Tandem stance 3x30" Feet together head turns x10 Feet together head nods x10 Feet together on foam x30" Feet together on foam alternating UE raises 2x10  07/18/23 Review of HEP and goals Standing: Hip abduction 2 x 10 Hip extension 2 x 10 Tandem stance 3 x 20" Marching alternating 2 x 10 with 1 UE assist Squats to chair 2 x 10  4" box Step ups x 10 4" box lateral step ups x 10   07/15/23 See HEP below    PATIENT EDUCATION: Education details: Exam findings, POC, initial HEP Person educated: Patient Education method: Explanation, Demonstration, and Handouts Education comprehension: verbalized understanding, returned demonstration, and needs further education  HOME EXERCISE PROGRAM: Access Code: [email or web address] URL: [email or web address] Date: 07/15/2023 Prepared by: Vernon Prey April Kirstie Peri  Exercises - Standing Hip Abduction with Counter Support  - 3 x daily - 7 x weekly - 1 sets - 10 reps - Standing Hip Extension with Counter Support  - 3 x daily - 7 x weekly - 1 sets - 10 reps - Standing Knee Flexion AROM with Chair Support  - 3 x daily - 7 x weekly - 1 sets - 10 reps - Standing Romberg to 1/2 Tandem Stance  - 3 x daily - 7 x weekly - 1 sets - 30 sec hold  GOALS: Goals reviewed with patient? Yes  SHORT TERM GOALS: Target date: 08/12/2023   Pt will be ind with initial HEP Baseline: Goal status: in progress  2.  Pt will demo at least 3/5 hip extension for improving single leg stability Baseline:  Goal status: in progress    LONG TERM GOALS: Target date: 09/09/2023   Pt will be ind with progression and advancement of HEP Baseline:  Goal status: in progress  2.  Pt will have improved Berg Balance Score to >/=52/56 to demo MCID Baseline: 44/56 Goal status: in progress  3.  Pt will have improved DGI to >/=22/24 to demo decreased fall  risk Baseline: 16/24 Goal status: in progress  4.  Pt will be able to perform 5 steps with reciprocal pattern with no handrail to demo increased hip/knee strength and stability Baseline:  Goal status: in progress  ASSESSMENT:  CLINICAL IMPRESSION: *** Continued progressing bilat LE strengthening and stability. Primarily working on foam for balance now with good pt tolerance. Challenged with staggered stance -- tends to keep weight too far forward.   Eval:Patient is a 77 y.o. M who was seen today for physical therapy evaluation and treatment for gait instability. Assessment significant for very weak proximal LE strength bilat leading to decreased stability and balance for  steps, ambulating on even and uneven surfaces limiting community and recreational tasks. Pt's DGI and Berg Balance both indicate a high fall risk. Pt will benefit from PT to address these deficits for safe mobility.   OBJECTIVE IMPAIRMENTS: Abnormal gait, decreased activity tolerance, decreased balance, decreased mobility, difficulty walking, decreased ROM, decreased strength, improper body mechanics, and postural dysfunction.    PLAN:  PT FREQUENCY: 2x/week  PT DURATION: 8 weeks  PLANNED INTERVENTIONS: Therapeutic exercises, Therapeutic activity, Neuromuscular re-education, Balance training, Gait training, Patient/Family education, Self Care, Joint mobilization, Stair training, Aquatic Therapy, Dry Needling, Electrical stimulation, Spinal mobilization, Cryotherapy, Moist heat, Taping, Traction, Manual therapy, and Re-evaluation  PLAN FOR NEXT SESSION: Continue hip strengthening, core strengthening and balance    7:50 AM, 08/03/23 Leylah Tarnow April Ma Alphonsa Overall, PT

## 2023-08-03 NOTE — Telephone Encounter (Signed)
Called pt due to missed appointment #1. Home phone number went to voicemail. Called mobile number and able to speak to pt. Pt had read his appointment paper wrong and had the wrong time for this morning as all of his other appointments were scheduled for 8:45am. Confirmed next visit for Monday.   Dalyla Chui April Ma Alphonsa Overall, PT

## 2023-08-07 NOTE — Progress Notes (Unsigned)
[facility] [place]  ASSESSMENT / PLAN: Timothy Brennan is a 77 y.o. male with atherosclerosis of native arteries of bilateral lower extremities causing no symptoms.  Patient counseled patients with asymptomatic peripheral arterial disease or claudication have a 1-2% risk of developing chronic limb threatening ischemia, but a 15-30% risk of mortality in the next 5 years. Intervention should only be considered for medically optimized patients with disabling symptoms.   Recommend:  Abstinence from all tobacco products. Blood glucose control with goal A1c < 7%. Blood pressure control with goal blood pressure < 140/90 mmHg. Lipid reduction therapy with goal LDL-C <100 mg/dL  Aspirin 81mg  PO QD.  Atorvastatin 40-80mg  PO QD (or other "high intensity" statin therapy).  Peripheral arterial disease cannot explain the patient's gait disturbance.  Encouraged him to follow-up with his primary care physician for further workup.  CHIEF COMPLAINT: Gait disturbance  HISTORY OF PRESENT ILLNESS: Timothy Brennan is a 77 y.o. male referred to clinic for evaluation of possible peripheral arterial disease.  The patient describes a month-long history of gait disturbance.  This will occur when he gets up to move around.  Bystanders frequently will get up to catch him.  The patient has noted knowledge that his gait is abnormal.  He knows he is nearly fallen.  He has no pain in his lower extremities.Marland Kitchen  He can walk as long as far as he likes without cramping discomfort in his legs.  He describes no rest pain in his feet.  He has no ulcers about his feet.   Past Medical History:  Diagnosis Date   Coronary artery disease    Diabetes mellitus    Hyperlipidemia    Hypertension     Past Surgical History:  Procedure Laterality Date   CARDIAC CATHETERIZATION  2007   with stent placement   COLONOSCOPY N/A 11/28/2019   Procedure: COLONOSCOPY;  Surgeon: Corbin Ade, MD;  [facility];  Service: Endoscopy;  Laterality: N/A;  1:00   INGUINAL HERNIA REPAIR  11/15/2011   Procedure: HERNIA REPAIR INGUINAL ADULT;  Surgeon: Dalia Heading;  Location: [facility];  Service: General;  Laterality: Left;   INGUINAL HERNIA REPAIR Right 04/08/2021   Procedure: HERNIA REPAIR INGUINAL ADULT W/MESH;  Surgeon: Franky Macho, MD;  Location: [facility];  Service: General;  Laterality: Right;   MOUTH SURGERY     PARTIAL COLECTOMY N/A 12/12/2019   Procedure: PARTIAL COLECTOMY;  Surgeon: Franky Macho, MD;  Location: [facility];  Service: General;  Laterality: N/A;   POLYPECTOMY  11/28/2019   Procedure: POLYPECTOMY;  Surgeon: Corbin Ade, MD;  [facility];  Service: Endoscopy;;   ROTATOR CUFF REPAIR     left    Family History  Problem Relation Age of Onset   Cancer Mother    Cancer Father    Anesthesia problems Neg Hx    Hypotension Neg Hx    Malignant hyperthermia Neg Hx    Pseudochol deficiency Neg Hx    Colon cancer Neg Hx     Social History   Socioeconomic History   Marital status: Married    Spouse name: Not on file   Number of children: Not on file   Years of education: Not on file   Highest education level: Not on file  Occupational History   Not on file  Tobacco Use   Smoking status: Former    Current packs/day: 0.00    Types: Cigarettes    Quit  date: 07/1969    Years since quitting: 54.1   Smokeless tobacco: Never  Vaping Use   Vaping status: Not on file  Substance and Sexual Activity   Alcohol use: No   Drug use: No   Sexual activity: Yes  Other Topics Concern   Not on file  Social History Narrative   Not on file   Social Determinants of Health   Financial Resource Strain: Not on file  Food Insecurity: Not on file  Transportation Needs: Not on file  Physical Activity: Not on file  Stress: Not on file  Social Connections: Not on file  Intimate Partner Violence: Not on file    Allergies  Allergen Reactions   Penicillins Hives and Nausea And  Vomiting    Did it involve swelling of the face/tongue/throat, SOB, or low BP? No Did it involve sudden or severe rash/hives, skin peeling, or any reaction on the inside of your mouth or nose? No Did you need to seek medical attention at a hospital or doctor's office? No When did it last happen?       If all above answers are "NO", may proceed with cephalosporin use.     Current Outpatient Medications  Medication Sig Dispense Refill   amLODipine (NORVASC) 10 MG tablet Take 10 mg by mouth daily.     aspirin 325 MG tablet Take 325 mg by mouth daily.     atorvastatin (LIPITOR) 10 MG tablet Take 10 mg by mouth daily.     Carboxymethylcellul-Glycerin (CLEAR EYES FOR DRY EYES) 1-0.25 % SOLN Place 1 drop into both eyes daily as needed (dry eyes).     lisinopril (ZESTRIL) 20 MG tablet Take 1 tablet by mouth daily.     quinapril (ACCUPRIL) 20 MG tablet Take 20 mg by mouth daily.     sildenafil (VIAGRA) 50 MG tablet Take 1 tablet as needed by oral route as directed.     sitaGLIPtan-metformin (JANUMET) 50-500 MG per tablet Take 1 tablet by mouth 2 (two) times daily with a meal.     traMADol (ULTRAM) 50 MG tablet Take 1 tablet (50 mg total) by mouth every 6 (six) hours as needed. 25 tablet 0   hydrochlorothiazide (,MICROZIDE/HYDRODIURIL,) 12.5 MG capsule TAKE 1 CAPSULE EVERY MORNING (Patient not taking: Reported on 08/08/2023) 30 capsule 6   No current facility-administered medications for this visit.    PHYSICAL EXAM Vitals:   08/08/23 1110  BP: (!) 160/76  Pulse: 63  Resp: 16  Temp: 98.5 F (36.9 C)  TempSrc: Temporal  SpO2: 98%  Weight: 169 lb 3.2 oz (76.7 kg)  Height: 5\' 8"  (1.727 m)    Elderly man in no acute distress Regular rate and rhythm Unlabored breathing No palpable pedal pulses Feet are warm   PERTINENT LABORATORY AND RADIOLOGIC DATA  Most recent CBC    Latest Ref Rng & Units 04/07/2021   10:06 AM 12/15/2019    5:23 AM 12/14/2019    6:32 AM  CBC  WBC 4.0 - 10.5 K/uL  6.0  6.7  9.1   Hemoglobin 13.0 - 17.0 g/dL 60.4  54.0  98.1   Hematocrit 39.0 - 52.0 % 40.0  36.0  39.3   Platelets 150 - 400 K/uL 223  187  207      Most recent CMP    Latest Ref Rng & Units 04/07/2021   10:06 AM 12/15/2019    5:23 AM 12/14/2019    6:32 AM  CMP  Glucose 70 - 99 mg/dL  131  145  158   BUN 8 - 23 mg/dL 22  5  6    Creatinine 0.61 - 1.24 mg/dL 4.09  8.11  9.14   Sodium 135 - 145 mmol/L 138  139  136   Potassium 3.5 - 5.1 mmol/L 3.8  4.0  3.9   Chloride 98 - 111 mmol/L 105  106  104   CO2 22 - 32 mmol/L 23  24  23    Calcium 8.9 - 10.3 mg/dL 9.4  8.5  8.5     Hgb N8G MFr Bld (%)  Date Value  04/07/2021 6.0 (H)   FINDINGS: Right ABI:  Noncompressible   Left ABI:  0.63   Right Lower Extremity: Posterior tibial and dorsalis pedis artery waveforms are monophasic.   Left Lower Extremity: Posterior tibial and dorsalis pedis artery waveforms are monophasic.  Rande Brunt. Lenell Antu, MD FACS [facility] Phone Number: [phone] 08/08/2023 1:11 PM   Total time spent on preparing this encounter including chart review, data review, collecting history, examining the patient, coordinating care for this new patient, 45 minutes.  Portions of this report may have been transcribed using voice recognition software.  Every effort has been made to ensure accuracy; however, inadvertent computerized transcription errors may still be present.

## 2023-08-08 ENCOUNTER — Encounter (HOSPITAL_COMMUNITY): Payer: Self-pay | Admitting: Physical Therapy

## 2023-08-08 ENCOUNTER — Ambulatory Visit (HOSPITAL_COMMUNITY): Payer: Medicare HMO | Admitting: Physical Therapy

## 2023-08-08 ENCOUNTER — Encounter: Payer: Self-pay | Admitting: Vascular Surgery

## 2023-08-08 ENCOUNTER — Ambulatory Visit: Payer: Medicare HMO | Admitting: Vascular Surgery

## 2023-08-08 VITALS — BP 160/76 | HR 63 | Temp 98.5°F | Resp 16 | Ht 68.0 in | Wt 169.2 lb

## 2023-08-08 DIAGNOSIS — R2681 Unsteadiness on feet: Secondary | ICD-10-CM

## 2023-08-08 DIAGNOSIS — R269 Unspecified abnormalities of gait and mobility: Secondary | ICD-10-CM

## 2023-08-08 DIAGNOSIS — M6281 Muscle weakness (generalized): Secondary | ICD-10-CM

## 2023-08-08 DIAGNOSIS — R2689 Other abnormalities of gait and mobility: Secondary | ICD-10-CM | POA: Diagnosis not present

## 2023-08-08 NOTE — Therapy (Signed)
OUTPATIENT PHYSICAL THERAPY NEURO TREATMENT   Patient Name: Timothy Brennan MRN: [id number] DOB:18-Aug-1946, 77 y.o., male Today's Date: 08/08/2023   PCP: Benita Stabile, MD REFERRING PROVIDER: Benita Stabile, MD  END OF SESSION:  PT End of Session - 08/08/23 0837     Visit Number 7    Number of Visits 16    Date for PT Re-Evaluation 09/09/23    Authorization Type [employer]    Progress Note Due on Visit 10    PT Start Time 0845    PT Stop Time 0925    PT Time Calculation (min) 40 min    Activity Tolerance Patient tolerated treatment well    Behavior During Therapy [facility] for tasks assessed/performed                Past Medical History:  Diagnosis Date   Coronary artery disease    Diabetes mellitus    Hyperlipidemia    Hypertension    Past Surgical History:  Procedure Laterality Date   CARDIAC CATHETERIZATION  2007   with stent placement   COLONOSCOPY N/A 11/28/2019   Procedure: COLONOSCOPY;  Surgeon: Corbin Ade, MD;  [facility];  Service: Endoscopy;  Laterality: N/A;  1:00   INGUINAL HERNIA REPAIR  11/15/2011   Procedure: HERNIA REPAIR INGUINAL ADULT;  Surgeon: Dalia Heading;  Location: [facility];  Service: General;  Laterality: Left;   INGUINAL HERNIA REPAIR Right 04/08/2021   Procedure: HERNIA REPAIR INGUINAL ADULT W/MESH;  Surgeon: Franky Macho, MD;  Location: [facility];  Service: General;  Laterality: Right;   MOUTH SURGERY     PARTIAL COLECTOMY N/A 12/12/2019   Procedure: PARTIAL COLECTOMY;  Surgeon: Franky Macho, MD;  Location: [facility];  Service: General;  Laterality: N/A;   POLYPECTOMY  11/28/2019   Procedure: POLYPECTOMY;  Surgeon: Corbin Ade, MD;  [facility];  Service: Endoscopy;;   ROTATOR CUFF REPAIR     left   Patient Active Problem List   Diagnosis Date Noted   Pain due to onychomycosis of toenails of both feet 03/09/2023   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   Right inguinal hernia    S/P partial colectomy 12/12/2019   Dysplastic  polyp of colon    Encounter for screening colonoscopy 08/06/2019   Essential hypertension 08/06/2019   Hyperlipidemia 08/06/2019   Diabetes mellitus without complication (HCC) 05/24/2009   CAD, UNSPECIFIED SITE 05/24/2009   DYSPNEA ON EXERTION 05/24/2009    ONSET DATE: Chronic  REFERRING DIAG: R26.89 (ICD-10-CM) - Other abnormalities of gait and mobility  THERAPY DIAG:  Muscle weakness (generalized)  Unsteadiness on feet  Other abnormalities of gait and mobility  Rationale for Evaluation and Treatment: Rehabilitation  SUBJECTIVE:  SUBJECTIVE STATEMENT: Pt states he is sore. He did all of his exercises this weekend. Can feel it all in the back of his legs. Also did a lot of yard work.   EVAL:Pt states he's here for balance. Pt reports he is stumbling and wobbly. Reports multiple near falls. States he can get up and feel wobbly. Pt reports he has to use rail when going up/down steps. "Legs feel like lead" Pt accompanied by: self  PERTINENT HISTORY: n/a  PAIN:  Are you having pain? No  PRECAUTIONS: Fall  RED FLAGS: None   WEIGHT BEARING RESTRICTIONS: No  FALLS: Has patient fallen in last 6 months? No  LIVING ENVIRONMENT: Lives with: lives with their spouse Lives in: House/apartment Stairs: No Has following equipment at home: None  PLOF: Independent -- plays golf, mow grass (uses push mower), preaching  PATIENT GOALS: Improve balance  OBJECTIVE:   DIAGNOSTIC FINDINGS: n/a  MUSCLE LENGTH: WNL  DTRs:  No issues  POSTURE: pelvis primarily rotated posteriorly in standing  LOWER EXTREMITY ROM:     Active  Right Eval Left Eval  Hip flexion    Hip extension    Hip abduction    Hip adduction    Hip internal rotation    Hip external rotation    Knee flexion    Knee  extension    Ankle dorsiflexion    Ankle plantarflexion    Ankle inversion    Ankle eversion     (Blank rows = not tested)  LOWER EXTREMITY MMT:    MMT Right Eval Left Eval  Hip flexion 4 4  Hip extension 2+ 2+  Hip abduction 3 3  Hip adduction    Hip internal rotation    Hip external rotation    Knee flexion 4- 4-  Knee extension 4+ 4+  Ankle dorsiflexion    Ankle plantarflexion 4+ 4+  Ankle inversion    Ankle eversion    (Blank rows = not tested)  BED MOBILITY:  Independent throughout but slow due to arm sore from COVID and flu shot  STAIRS: Level of Assistance: Modified independence Stair Negotiation Technique: Step to Pattern with Single Rail on Right Number of Stairs: 5  Height of Stairs: 6"  Comments: Slow, hip drop  GAIT: Gait pattern: shuffling, ataxic, lateral lean- Right, lateral lean- Left, and wide BOS Distance walked: In clinic Assistive device utilized: None Level of assistance: Complete Independence  FUNCTIONAL TESTS:  5x STS 11.68 sec Berg Balance Test: 34/19 DGI: 16/24   PATIENT SURVEYS:  Did not assess  TODAY'S TREATMENT:                                                                                                                              DATE:  08/08/23 Nustep L5 x 5 min LEs only Side step green TB around knees 2x20' Forwards/backwards monster walk 2x20' Palloff press green TB with feet together 2x10 On blue foam  Staggered stance  2x30" R&L  Narrow feet diagonals red weighted ball x10, staggered stance x10 R&L  Staggered stance chest press forward with red weighted ball 2x10 R&L Chair Squat 2x10   08/01/23 Nustep L6 x 5 min UEs/LEs Runner's step up 6" step 2x10 with 1 UE support Side step 6" step 2x10 with intermittent 1 UE support On foam  Staggered stance press forward with 3# straight weight 2x10 R&L  Staggered stance head turns R&L 2x10  Slow marching with 3# straight weight 2x10  One foot on foam, other foot on 6"  step diagonals with 3# straight weight 2x10    07/27/23 Nustep L5 x 5 min LEs only Sidelying hip abd 2x10 Prone hip ext knee flexed 2x10 Supine SLR 2x10 Supine bridge 2x10 Squat to chair 2x10 Forward lunge on 6" step no UE 2x10 On foam feet together eyes open 2x30" On foam feet together head turns 2x10 On foam feet together head nods 2x10 On foam slow marching 2x10 with single UE support On foam heel raise 2x10   07/25/23 Hip abduction red TB around knees 2x10 Hip extension red TB 2x10 Marching alternating red TB 2 x 10 with 1 UE Squats to chair 2 x 10  Forward lunges onto 4" no UE for stabilization Tandem stance 3x30" Feet together head turns x10 Feet together head nods x10 Nustep L5 x 6 min LEs only ([place] seat 9  07/21/23 Nustep L5 x 5 min UEs/LEs Hip abduction red TB around knees 2x10 Hip extension 2x10 Hip flexor stretch 2x30" without UE to also encourage balance Marching alternating around knees 2 x 10 without support Squats to chair 2 x 10  Tandem stance 3x30" Feet together head turns x10 Feet together head nods x10 Feet together on foam x30" Feet together on foam alternating UE raises 2x10  07/18/23 Review of HEP and goals Standing: Hip abduction 2 x 10 Hip extension 2 x 10 Tandem stance 3 x 20" Marching alternating 2 x 10 with 1 UE assist Squats to chair 2 x 10  4" box Step ups x 10 4" box lateral step ups x 10   07/15/23 See HEP below    PATIENT EDUCATION: Education details: Exam findings, POC, initial HEP Person educated: Patient Education method: Explanation, Demonstration, and Handouts Education comprehension: verbalized understanding, returned demonstration, and needs further education  HOME EXERCISE PROGRAM: Access Code: [email or web address] URL: [email or web address] Date: 07/15/2023 Prepared by: Vernon Prey April Kirstie Peri  Exercises - Standing Hip Abduction with Counter Support  - 3 x daily - 7 x weekly - 1 sets - 10 reps -  Standing Hip Extension with Counter Support  - 3 x daily - 7 x weekly - 1 sets - 10 reps - Standing Knee Flexion AROM with Chair Support  - 3 x daily - 7 x weekly - 1 sets - 10 reps - Standing Romberg to 1/2 Tandem Stance  - 3 x daily - 7 x weekly - 1 sets - 30 sec hold  GOALS: Goals reviewed with patient? Yes  SHORT TERM GOALS: Target date: 08/12/2023   Pt will be ind with initial HEP Baseline: Goal status: in progress  2.  Pt will demo at least 3/5 hip extension for improving single leg stability Baseline:  Goal status: in progress    LONG TERM GOALS: Target date: 09/09/2023   Pt will be ind with progression and advancement of HEP Baseline:  Goal status: in progress  2.  Pt will have improved Berg Balance Score to >/=52/56 to  demo MCID Baseline: 44/56 Goal status: in progress  3.  Pt will have improved DGI to >/=22/24 to demo decreased fall risk Baseline: 16/24 Goal status: in progress  4.  Pt will be able to perform 5 steps with reciprocal pattern with no handrail to demo increased hip/knee strength and stability Baseline:  Goal status: in progress  ASSESSMENT:  CLINICAL IMPRESSION: Session focused on improving trunk and LE strength and stability. Requires CGA for safety. Excess trunk motion notable during side stepping and backwards movement. Continued to work on balance with staggered stance on foam. Encouraging pt to utilize hip extensors to keep trunk up (tends to bend forward in staggered stance).   Eval:Patient is a 77 y.o. M who was seen today for physical therapy evaluation and treatment for gait instability. Assessment significant for very weak proximal LE strength bilat leading to decreased stability and balance for steps, ambulating on even and uneven surfaces limiting community and recreational tasks. Pt's DGI and Berg Balance both indicate a high fall risk. Pt will benefit from PT to address these deficits for safe mobility.   OBJECTIVE IMPAIRMENTS:  Abnormal gait, decreased activity tolerance, decreased balance, decreased mobility, difficulty walking, decreased ROM, decreased strength, improper body mechanics, and postural dysfunction.    PLAN:  PT FREQUENCY: 2x/week  PT DURATION: 8 weeks  PLANNED INTERVENTIONS: Therapeutic exercises, Therapeutic activity, Neuromuscular re-education, Balance training, Gait training, Patient/Family education, Self Care, Joint mobilization, Stair training, Aquatic Therapy, Dry Needling, Electrical stimulation, Spinal mobilization, Cryotherapy, Moist heat, Taping, Traction, Manual therapy, and Re-evaluation  PLAN FOR NEXT SESSION: Continue hip strengthening, core strengthening and balance    8:38 AM, 08/08/23 Ecko Beasley April Ma Alphonsa Overall, PT

## 2023-08-10 ENCOUNTER — Encounter (HOSPITAL_COMMUNITY): Payer: Self-pay | Admitting: Physical Therapy

## 2023-08-10 ENCOUNTER — Ambulatory Visit (HOSPITAL_COMMUNITY): Payer: Medicare HMO | Attending: Internal Medicine | Admitting: Physical Therapy

## 2023-08-10 DIAGNOSIS — R2681 Unsteadiness on feet: Secondary | ICD-10-CM | POA: Diagnosis present

## 2023-08-10 DIAGNOSIS — M6281 Muscle weakness (generalized): Secondary | ICD-10-CM | POA: Insufficient documentation

## 2023-08-10 DIAGNOSIS — R2689 Other abnormalities of gait and mobility: Secondary | ICD-10-CM | POA: Diagnosis present

## 2023-08-10 NOTE — Therapy (Signed)
OUTPATIENT PHYSICAL THERAPY NEURO TREATMENT   Patient Name: Timothy Brennan MRN: [id number] DOB:1946/06/30, 77 y.o., male Today's Date: 08/10/2023   PCP: Benita Stabile, MD REFERRING PROVIDER: Benita Stabile, MD  END OF SESSION:  PT End of Session - 08/10/23 0854     Visit Number 8    Number of Visits 16    Date for PT Re-Evaluation 09/09/23    Authorization Type [employer]    Progress Note Due on Visit 10    PT Start Time 0850    PT Stop Time 0930    PT Time Calculation (min) 40 min    Activity Tolerance Patient tolerated treatment well    Behavior During Therapy [facility] for tasks assessed/performed                 Past Medical History:  Diagnosis Date   Coronary artery disease    Diabetes mellitus    Hyperlipidemia    Hypertension    Past Surgical History:  Procedure Laterality Date   CARDIAC CATHETERIZATION  2007   with stent placement   COLONOSCOPY N/A 11/28/2019   Procedure: COLONOSCOPY;  Surgeon: Corbin Ade, MD;  [facility];  Service: Endoscopy;  Laterality: N/A;  1:00   INGUINAL HERNIA REPAIR  11/15/2011   Procedure: HERNIA REPAIR INGUINAL ADULT;  Surgeon: Dalia Heading;  Location: [facility];  Service: General;  Laterality: Left;   INGUINAL HERNIA REPAIR Right 04/08/2021   Procedure: HERNIA REPAIR INGUINAL ADULT W/MESH;  Surgeon: Franky Macho, MD;  Location: [facility];  Service: General;  Laterality: Right;   MOUTH SURGERY     PARTIAL COLECTOMY N/A 12/12/2019   Procedure: PARTIAL COLECTOMY;  Surgeon: Franky Macho, MD;  Location: [facility];  Service: General;  Laterality: N/A;   POLYPECTOMY  11/28/2019   Procedure: POLYPECTOMY;  Surgeon: Corbin Ade, MD;  [facility];  Service: Endoscopy;;   ROTATOR CUFF REPAIR     left   Patient Active Problem List   Diagnosis Date Noted   Pain due to onychomycosis of toenails of both feet 03/09/2023   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   Right inguinal hernia    S/P partial colectomy 12/12/2019    Dysplastic polyp of colon    Encounter for screening colonoscopy 08/06/2019   Essential hypertension 08/06/2019   Hyperlipidemia 08/06/2019   Diabetes mellitus without complication (HCC) 05/24/2009   CAD, UNSPECIFIED SITE 05/24/2009   DYSPNEA ON EXERTION 05/24/2009    ONSET DATE: Chronic  REFERRING DIAG: R26.89 (ICD-10-CM) - Other abnormalities of gait and mobility  THERAPY DIAG:  Muscle weakness (generalized)  Unsteadiness on feet  Other abnormalities of gait and mobility  Rationale for Evaluation and Treatment: Rehabilitation  SUBJECTIVE:  SUBJECTIVE STATEMENT: Pt states he was sore for only a day after last session.   EVAL:Pt states he's here for balance. Pt reports he is stumbling and wobbly. Reports multiple near falls. States he can get up and feel wobbly. Pt reports he has to use rail when going up/down steps. "Legs feel like lead" Pt accompanied by: self  PERTINENT HISTORY: n/a  PAIN:  Are you having pain? No  PRECAUTIONS: Fall  RED FLAGS: None   WEIGHT BEARING RESTRICTIONS: No  FALLS: Has patient fallen in last 6 months? No  LIVING ENVIRONMENT: Lives with: lives with their spouse Lives in: House/apartment Stairs: No Has following equipment at home: None  PLOF: Independent -- plays golf, mow grass (uses push mower), preaching  PATIENT GOALS: Improve balance  OBJECTIVE:   DIAGNOSTIC FINDINGS: n/a  MUSCLE LENGTH: WNL  DTRs:  No issues  POSTURE: pelvis primarily rotated posteriorly in standing  LOWER EXTREMITY ROM:     Active  Right Eval Left Eval  Hip flexion    Hip extension    Hip abduction    Hip adduction    Hip internal rotation    Hip external rotation    Knee flexion    Knee extension    Ankle dorsiflexion    Ankle plantarflexion    Ankle  inversion    Ankle eversion     (Blank rows = not tested)  LOWER EXTREMITY MMT:    MMT Right Eval Left Eval  Hip flexion 4 4  Hip extension 2+ 2+  Hip abduction 3 3  Hip adduction    Hip internal rotation    Hip external rotation    Knee flexion 4- 4-  Knee extension 4+ 4+  Ankle dorsiflexion    Ankle plantarflexion 4+ 4+  Ankle inversion    Ankle eversion    (Blank rows = not tested)  BED MOBILITY:  Independent throughout but slow due to arm sore from COVID and flu shot  STAIRS: Level of Assistance: Modified independence Stair Negotiation Technique: Step to Pattern with Single Rail on Right Number of Stairs: 5  Height of Stairs: 6"  Comments: Slow, hip drop  GAIT: Gait pattern: shuffling, ataxic, lateral lean- Right, lateral lean- Left, and wide BOS Distance walked: In clinic Assistive device utilized: None Level of assistance: Complete Independence  FUNCTIONAL TESTS:  5x STS 11.68 sec Berg Balance Test: 44/56 DGI: 16/24   PATIENT SURVEYS:  Did not assess  TODAY'S TREATMENT:                                                                                                                              DATE:  08/10/23 Nustep L6 x 5 min UEs/LEs only Leg press plate 4 double legs 2x10 Runner's step up 6" step 2x10 Side step up and over 6" step 2x10 On blue foam  Staggered stance chest press forward with red weighted ball 2x10 R&L  Feet together diagonals red weighted ball  2x10  Staggered stance with toe touch on 2" surface diagonals red weighted ball 2x10   08/08/23 Nustep L5 x 5 min LEs only Side step green TB around knees 2x20' Forwards/backwards monster walk 2x20' Palloff press green TB with feet together 2x10 On blue foam  Staggered stance 2x30" R&L  Narrow feet diagonals red weighted ball x10, staggered stance x10 R&L  Staggered stance chest press forward with red weighted ball 2x10 R&L Chair Squat 2x10   08/01/23 Nustep L6 x 5 min  UEs/LEs Runner's step up 6" step 2x10 with 1 UE support Side step 6" step 2x10 with intermittent 1 UE support On foam  Staggered stance press forward with 3# straight weight 2x10 R&L  Staggered stance head turns R&L 2x10  Slow marching with 3# straight weight 2x10  One foot on foam, other foot on 6" step diagonals with 3# straight weight 2x10    07/27/23 Nustep L5 x 5 min LEs only Sidelying hip abd 2x10 Prone hip ext knee flexed 2x10 Supine SLR 2x10 Supine bridge 2x10 Squat to chair 2x10 Forward lunge on 6" step no UE 2x10 On foam feet together eyes open 2x30" On foam feet together head turns 2x10 On foam feet together head nods 2x10 On foam slow marching 2x10 with single UE support On foam heel raise 2x10   07/25/23 Hip abduction red TB around knees 2x10 Hip extension red TB 2x10 Marching alternating red TB 2 x 10 with 1 UE Squats to chair 2 x 10  Forward lunges onto 4" no UE for stabilization Tandem stance 3x30" Feet together head turns x10 Feet together head nods x10 Nustep L5 x 6 min LEs only ([place]) seat 9  07/21/23 Nustep L5 x 5 min UEs/LEs Hip abduction red TB around knees 2x10 Hip extension 2x10 Hip flexor stretch 2x30" without UE to also encourage balance Marching alternating around knees 2 x 10 without support Squats to chair 2 x 10  Tandem stance 3x30" Feet together head turns x10 Feet together head nods x10 Feet together on foam x30" Feet together on foam alternating UE raises 2x10  07/18/23 Review of HEP and goals Standing: Hip abduction 2 x 10 Hip extension 2 x 10 Tandem stance 3 x 20" Marching alternating 2 x 10 with 1 UE assist Squats to chair 2 x 10  4" box Step ups x 10 4" box lateral step ups x 10   07/15/23 See HEP below    PATIENT EDUCATION: Education details: Exam findings, POC, initial HEP Person educated: Patient Education method: Explanation, Demonstration, and Handouts Education comprehension: verbalized understanding,  returned demonstration, and needs further education  HOME EXERCISE PROGRAM: Access Code: [email or web address] URL: [email or web address] Date: 08/10/2023 Prepared by: Vernon Prey April Kirstie Peri  Exercises - Standing Knee Flexion AROM with Chair Support  - 3 x daily - 7 x weekly - 1 sets - 10 reps - Squat with Chair Touch  - 3 x daily - 7 x weekly - 1 sets - 10 reps - Wide Tandem Stance on Foam Pad with Eyes Open  - 1 x daily - 7 x weekly - 3 sets - 10 reps - Side Stepping with Resistance at Thighs  - 1 x daily - 7 x weekly - 3 sets - 10 reps - Backward Walking with Counter Support  - 1 x daily - 7 x weekly - 3 sets - 10 reps - Forward Monster Walks  - 1 x daily - 7 x weekly - 3 sets - 10 reps -  Runner's Step Up/Down  - 1 x daily - 7 x weekly - 3 sets - 10 reps  GOALS: Goals reviewed with patient? Yes  SHORT TERM GOALS: Target date: 08/12/2023   Pt will be ind with initial HEP Baseline: Goal status: in progress  2.  Pt will demo at least 3/5 hip extension for improving single leg stability Baseline:  Goal status: in progress    LONG TERM GOALS: Target date: 09/09/2023   Pt will be ind with progression and advancement of HEP Baseline:  Goal status: in progress  2.  Pt will have improved Berg Balance Score to >/=52/56 to demo MCID Baseline: 44/56 Goal status: in progress  3.  Pt will have improved DGI to >/=22/24 to demo decreased fall risk Baseline: 16/24 Goal status: in progress  4.  Pt will be able to perform 5 steps with reciprocal pattern with no handrail to demo increased hip/knee strength and stability Baseline:  Goal status: in progress  ASSESSMENT:  CLINICAL IMPRESSION: Continued to progress hip and core strengthening on steps today. Trunk remains unstable requiring CGA for safety when pt not using UEs for support. Requires tactile cueing to keep trunk from flexing and to keep glutes activated in staggered stance with UE movement.   Eval:Patient is a 77  y.o. M who was seen today for physical therapy evaluation and treatment for gait instability. Assessment significant for very weak proximal LE strength bilat leading to decreased stability and balance for steps, ambulating on even and uneven surfaces limiting community and recreational tasks. Pt's DGI and Berg Balance both indicate a high fall risk. Pt will benefit from PT to address these deficits for safe mobility.   OBJECTIVE IMPAIRMENTS: Abnormal gait, decreased activity tolerance, decreased balance, decreased mobility, difficulty walking, decreased ROM, decreased strength, improper body mechanics, and postural dysfunction.    PLAN:  PT FREQUENCY: 2x/week  PT DURATION: 8 weeks  PLANNED INTERVENTIONS: Therapeutic exercises, Therapeutic activity, Neuromuscular re-education, Balance training, Gait training, Patient/Family education, Self Care, Joint mobilization, Stair training, Aquatic Therapy, Dry Needling, Electrical stimulation, Spinal mobilization, Cryotherapy, Moist heat, Taping, Traction, Manual therapy, and Re-evaluation  PLAN FOR NEXT SESSION: Continue hip strengthening, core strengthening and balance. Hip hinging for glute activation, check STGs    8:55 AM, 08/10/23 Bridey [place]over April Ma Alphonsa Overall, PT

## 2023-08-15 ENCOUNTER — Ambulatory Visit (HOSPITAL_COMMUNITY): Payer: Medicare HMO | Admitting: Physical Therapy

## 2023-08-15 ENCOUNTER — Encounter (HOSPITAL_COMMUNITY): Payer: Self-pay | Admitting: Physical Therapy

## 2023-08-15 DIAGNOSIS — M6281 Muscle weakness (generalized): Secondary | ICD-10-CM | POA: Diagnosis not present

## 2023-08-15 DIAGNOSIS — R2689 Other abnormalities of gait and mobility: Secondary | ICD-10-CM

## 2023-08-15 DIAGNOSIS — R2681 Unsteadiness on feet: Secondary | ICD-10-CM

## 2023-08-15 NOTE — Therapy (Signed)
OUTPATIENT PHYSICAL THERAPY NEURO TREATMENT   Patient Name: Timothy Brennan MRN: [id number] DOB:[birth year], 77 y.o., male Today's Date: 08/15/2023   PCP: Benita Stabile, MD REFERRING PROVIDER: Benita Stabile, MD  END OF SESSION:  PT End of Session - 08/15/23 0848     Visit Number 9    Number of Visits 16    Date for PT Re-Evaluation 09/09/23    Authorization Type [employer]    Progress Note Due on Visit 10    PT Start Time 0845    PT Stop Time 0925    PT Time Calculation (min) 40 min    Activity Tolerance Patient tolerated treatment well    Behavior During Therapy [facility] for tasks assessed/performed                 Past Medical History:  Diagnosis Date   Coronary artery disease    Diabetes mellitus    Hyperlipidemia    Hypertension    Past Surgical History:  Procedure Laterality Date   CARDIAC CATHETERIZATION  2007   with stent placement   COLONOSCOPY N/A 11/28/2019   Procedure: COLONOSCOPY;  Surgeon: Corbin Ade, MD;  [facility];  Service: Endoscopy;  Laterality: N/A;  1:00   INGUINAL HERNIA REPAIR  11/15/2011   Procedure: HERNIA REPAIR INGUINAL ADULT;  Surgeon: Dalia Heading;  Location: [facility];  Service: General;  Laterality: Left;   INGUINAL HERNIA REPAIR Right 04/08/2021   Procedure: HERNIA REPAIR INGUINAL ADULT W/MESH;  Surgeon: Franky Macho, MD;  Location: [facility];  Service: General;  Laterality: Right;   MOUTH SURGERY     PARTIAL COLECTOMY N/A 12/12/2019   Procedure: PARTIAL COLECTOMY;  Surgeon: Franky Macho, MD;  Location: [facility];  Service: General;  Laterality: N/A;   POLYPECTOMY  11/28/2019   Procedure: POLYPECTOMY;  Surgeon: Corbin Ade, MD;  [facility];  Service: Endoscopy;;   ROTATOR CUFF REPAIR     left   Patient Active Problem List   Diagnosis Date Noted   Pain due to onychomycosis of toenails of both feet 03/09/2023   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   Right inguinal hernia    S/P partial colectomy 12/12/2019    Dysplastic polyp of colon    Encounter for screening colonoscopy 08/06/2019   Essential hypertension 08/06/2019   Hyperlipidemia 08/06/2019   Diabetes mellitus without complication (HCC) 05/24/2009   CAD, UNSPECIFIED SITE 05/24/2009   DYSPNEA ON EXERTION 05/24/2009    ONSET DATE: Chronic  REFERRING DIAG: R26.89 (ICD-10-CM) - Other abnormalities of gait and mobility  THERAPY DIAG:  Muscle weakness (generalized)  Unsteadiness on feet  Other abnormalities of gait and mobility  Rationale for Evaluation and Treatment: Rehabilitation  SUBJECTIVE:  SUBJECTIVE STATEMENT: Pt reports no new complaints.   EVAL:Pt states he's here for balance. Pt reports he is stumbling and wobbly. Reports multiple near falls. States he can get up and feel wobbly. Pt reports he has to use rail when going up/down steps. "Legs feel like lead" Pt accompanied by: self  PERTINENT HISTORY: n/a  PAIN:  Are you having pain? No  PRECAUTIONS: Fall  RED FLAGS: None   WEIGHT BEARING RESTRICTIONS: No  FALLS: Has patient fallen in last 6 months? No  LIVING ENVIRONMENT: Lives with: lives with their spouse Lives in: House/apartment Stairs: No Has following equipment at home: None  PLOF: Independent -- plays golf, mow grass (uses push mower), preaching  PATIENT GOALS: Improve balance  OBJECTIVE:   DIAGNOSTIC FINDINGS: n/a  MUSCLE LENGTH: WNL  DTRs:  No issues  POSTURE: pelvis primarily rotated posteriorly in standing  LOWER EXTREMITY ROM:     Active  Right Eval Left Eval  Hip flexion    Hip extension    Hip abduction    Hip adduction    Hip internal rotation    Hip external rotation    Knee flexion    Knee extension    Ankle dorsiflexion    Ankle plantarflexion    Ankle inversion    Ankle eversion      (Blank rows = not tested)  LOWER EXTREMITY MMT:    MMT Right Eval Left Eval Right 08/15/23 Left 08/15/23  Hip flexion 4 4    Hip extension 2+ 2+ 3- 3-  Hip abduction 3 3    Hip adduction      Hip internal rotation      Hip external rotation      Knee flexion 4- 4-    Knee extension 4+ 4+    Ankle dorsiflexion      Ankle plantarflexion 4+ 4+    Ankle inversion      Ankle eversion      (Blank rows = not tested)  BED MOBILITY:  Independent throughout but slow due to arm sore from COVID and flu shot  STAIRS: Level of Assistance: Modified independence Stair Negotiation Technique: Step to Pattern with Single Rail on Right Number of Stairs: 5  Height of Stairs: 6"  Comments: Slow, hip drop  GAIT: Gait pattern: shuffling, ataxic, lateral lean- Right, lateral lean- Left, and wide BOS Distance walked: In clinic Assistive device utilized: None Level of assistance: Complete Independence  FUNCTIONAL TESTS:  5x STS 11.68 sec Berg Balance Test: 95/28 DGI: 16/24   PATIENT SURVEYS:  Did not assess  TODAY'S TREATMENT:                                                                                                                              DATE:  08/15/23 Nustep L6 x 5 min UEs/LEs Leg press plate 4 double legs x10, single leg plate 3 U13 each Shoulder ext green TB for core engagement 2x10 Backwards lunge  2x10 with UE support. Required max verbal and tactile cueing Staggered stance balance 2x30" SLS 2x20" with intermittent UE support Tandem walking in // bars x3 laps Anti trunk rotation side step green TB 2x10    08/10/23 Nustep L6 x 5 min UEs/LEs only Leg press plate 4 double legs 2x10 Runner's step up 6" step 2x10 Side step up and over 6" step 2x10 On blue foam  Staggered stance chest press forward with red weighted ball 2x10 R&L  Feet together diagonals red weighted ball 2x10  Staggered stance with toe touch on 2" surface diagonals red weighted ball  2x10   08/08/23 Nustep L5 x 5 min LEs only Side step green TB around knees 2x20' Forwards/backwards monster walk 2x20' Palloff press green TB with feet together 2x10 On blue foam  Staggered stance 2x30" R&L  Narrow feet diagonals red weighted ball x10, staggered stance x10 R&L  Staggered stance chest press forward with red weighted ball 2x10 R&L Chair Squat 2x10   08/01/23 Nustep L6 x 5 min UEs/LEs Runner's step up 6" step 2x10 with 1 UE support Side step 6" step 2x10 with intermittent 1 UE support On foam  Staggered stance press forward with 3# straight weight 2x10 R&L  Staggered stance head turns R&L 2x10  Slow marching with 3# straight weight 2x10  One foot on foam, other foot on 6" step diagonals with 3# straight weight 2x10    07/27/23 Nustep L5 x 5 min LEs only Sidelying hip abd 2x10 Prone hip ext knee flexed 2x10 Supine SLR 2x10 Supine bridge 2x10 Squat to chair 2x10 Forward lunge on 6" step no UE 2x10 On foam feet together eyes open 2x30" On foam feet together head turns 2x10 On foam feet together head nods 2x10 On foam slow marching 2x10 with single UE support On foam heel raise 2x10   07/25/23 Hip abduction red TB around knees 2x10 Hip extension red TB 2x10 Marching alternating red TB 2 x 10 with 1 UE Squats to chair 2 x 10  Forward lunges onto 4" no UE for stabilization Tandem stance 3x30" Feet together head turns x10 Feet together head nods x10 Nustep L5 x 6 min LEs only ([place]) seat 9  07/21/23 Nustep L5 x 5 min UEs/LEs Hip abduction red TB around knees 2x10 Hip extension 2x10 Hip flexor stretch 2x30" without UE to also encourage balance Marching alternating around knees 2 x 10 without support Squats to chair 2 x 10  Tandem stance 3x30" Feet together head turns x10 Feet together head nods x10 Feet together on foam x30" Feet together on foam alternating UE raises 2x10  07/18/23 Review of HEP and goals Standing: Hip abduction 2 x 10 Hip  extension 2 x 10 Tandem stance 3 x 20" Marching alternating 2 x 10 with 1 UE assist Squats to chair 2 x 10  4" box Step ups x 10 4" box lateral step ups x 10   07/15/23 See HEP below    PATIENT EDUCATION: Education details: Exam findings, POC, initial HEP Person educated: Patient Education method: Explanation, Demonstration, and Handouts Education comprehension: verbalized understanding, returned demonstration, and needs further education  HOME EXERCISE PROGRAM: Access Code: [email or web address] URL: [email or web address] Date: 08/10/2023 Prepared by: Vernon Prey April Kirstie Peri  Exercises - Standing Knee Flexion AROM with Chair Support  - 3 x daily - 7 x weekly - 1 sets - 10 reps - Squat with Chair Touch  - 3 x daily - 7 x weekly - 1 sets -  10 reps - Wide Tandem Stance on Foam Pad with Eyes Open  - 1 x daily - 7 x weekly - 3 sets - 10 reps - Side Stepping with Resistance at Thighs  - 1 x daily - 7 x weekly - 3 sets - 10 reps - Backward Walking with Counter Support  - 1 x daily - 7 x weekly - 3 sets - 10 reps - Forward Monster Walks  - 1 x daily - 7 x weekly - 3 sets - 10 reps - Runner's Step Up/Down  - 1 x daily - 7 x weekly - 3 sets - 10 reps  GOALS: Goals reviewed with patient? Yes  SHORT TERM GOALS: Target date: 08/12/2023   Pt will be ind with initial HEP Baseline: Goal status: MET  2.  Pt will demo at least 3/5 hip extension for improving single leg stability Baseline:  Goal status: in progress    LONG TERM GOALS: Target date: 09/09/2023   Pt will be ind with progression and advancement of HEP Baseline:  Goal status: in progress  2.  Pt will have improved Berg Balance Score to >/=52/56 to demo MCID Baseline: 44/56 Goal status: in progress  3.  Pt will have improved DGI to >/=22/24 to demo decreased fall risk Baseline: 16/24 Goal status: in progress  4.  Pt will be able to perform 5 steps with reciprocal pattern with no handrail to demo increased  hip/knee strength and stability Baseline:  Goal status: in progress  ASSESSMENT:  CLINICAL IMPRESSION: Geared on progressive strengthening. Difficulty with backwards lunging and trying to obtain more glute and core engagement with more dynamic motions. Balance continues to progress well.   Eval:Patient is a 77 y.o. M who was seen today for physical therapy evaluation and treatment for gait instability. Assessment significant for very weak proximal LE strength bilat leading to decreased stability and balance for steps, ambulating on even and uneven surfaces limiting community and recreational tasks. Pt's DGI and Berg Balance both indicate a high fall risk. Pt will benefit from PT to address these deficits for safe mobility.   OBJECTIVE IMPAIRMENTS: Abnormal gait, decreased activity tolerance, decreased balance, decreased mobility, difficulty walking, decreased ROM, decreased strength, improper body mechanics, and postural dysfunction.    PLAN:  PT FREQUENCY: 2x/week  PT DURATION: 8 weeks  PLANNED INTERVENTIONS: Therapeutic exercises, Therapeutic activity, Neuromuscular re-education, Balance training, Gait training, Patient/Family education, Self Care, Joint mobilization, Stair training, Aquatic Therapy, Dry Needling, Electrical stimulation, Spinal mobilization, Cryotherapy, Moist heat, Taping, Traction, Manual therapy, and Re-evaluation  PLAN FOR NEXT SESSION: Continue hip strengthening, core strengthening and balance. Hip hinging for glute activation    8:48 AM, 08/15/23 Raquel Racey April Ma Alphonsa Overall, PT

## 2023-08-17 ENCOUNTER — Ambulatory Visit (HOSPITAL_COMMUNITY): Payer: Medicare HMO

## 2023-08-17 DIAGNOSIS — R2689 Other abnormalities of gait and mobility: Secondary | ICD-10-CM

## 2023-08-17 DIAGNOSIS — M6281 Muscle weakness (generalized): Secondary | ICD-10-CM

## 2023-08-17 DIAGNOSIS — R2681 Unsteadiness on feet: Secondary | ICD-10-CM

## 2023-08-17 NOTE — Therapy (Signed)
OUTPATIENT PHYSICAL THERAPY NEURO PROGRESS NOTE   Patient Name: Timothy Brennan MRN: [id number] DOB:January 04, 1946, 77 y.o., male Today's Date: 08/17/2023   PCP: Benita Stabile, MD REFERRING PROVIDER: Benita Stabile, MD  END OF SESSION:  PT End of Session - 08/17/23 0854     Visit Number 10    Number of Visits 18    Date for PT Re-Evaluation 09/14/23    Authorization Type [employer]    Progress Note Due on Visit 18    PT Start Time 0845    PT Stop Time 0925    PT Time Calculation (min) 40 min    Activity Tolerance Patient tolerated treatment well    Behavior During Therapy [facility] for tasks assessed/performed            Past Medical History:  Diagnosis Date   Coronary artery disease    Diabetes mellitus    Hyperlipidemia    Hypertension    Past Surgical History:  Procedure Laterality Date   CARDIAC CATHETERIZATION  2007   with stent placement   COLONOSCOPY N/A 11/28/2019   Procedure: COLONOSCOPY;  Surgeon: Corbin Ade, MD;  Location: [facility];  Service: Endoscopy;  Laterality: N/A;  1:00   INGUINAL HERNIA REPAIR  11/15/2011   Procedure: HERNIA REPAIR INGUINAL ADULT;  Surgeon: Dalia Heading;  Location: [facility];  Service: General;  Laterality: Left;   INGUINAL HERNIA REPAIR Right 04/08/2021   Procedure: HERNIA REPAIR INGUINAL ADULT W/MESH;  Surgeon: Franky Macho, MD;  Location: [facility];  Service: General;  Laterality: Right;   MOUTH SURGERY     PARTIAL COLECTOMY N/A 12/12/2019   Procedure: PARTIAL COLECTOMY;  Surgeon: Franky Macho, MD;  Location: [facility];  Service: General;  Laterality: N/A;   POLYPECTOMY  11/28/2019   Procedure: POLYPECTOMY;  Surgeon: Corbin Ade, MD;  Location: [facility];  Service: Endoscopy;;   ROTATOR CUFF REPAIR     left   Patient Active Problem List   Diagnosis Date Noted   Pain due to onychomycosis of toenails of both feet 03/09/2023   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   Right inguinal hernia    S/P partial colectomy 12/12/2019   Dysplastic  polyp of colon    Encounter for screening colonoscopy 08/06/2019   Essential hypertension 08/06/2019   Hyperlipidemia 08/06/2019   Diabetes mellitus without complication (HCC) 05/24/2009   CAD, UNSPECIFIED SITE 05/24/2009   DYSPNEA ON EXERTION 05/24/2009   Progress Note Reporting Period 07/15/23 to 08/17/23  See note below for Objective Data and Assessment of Progress/Goals.     ONSET DATE: Chronic  REFERRING DIAG: R26.89 (ICD-10-CM) - Other abnormalities of gait and mobility  THERAPY DIAG:  Muscle weakness (generalized)  Unsteadiness on feet  Other abnormalities of gait and mobility  Rationale for Evaluation and Treatment: Rehabilitation  SUBJECTIVE:  SUBJECTIVE STATEMENT: Patient states that his balance has gotten better such that he has improved around 40%. Patient reports that he still has difficulty going upstairs and needs to hold on to something for support after 5-10 minutes of standing. Patient is unsure if he needs to continue with PT or not.  EVAL:Pt states he's here for balance. Pt reports he is stumbling and wobbly. Reports multiple near falls. States he can get up and feel wobbly. Pt reports he has to use rail when going up/down steps. "Legs feel like lead" Pt accompanied by: self  PERTINENT HISTORY: n/a  PAIN:  Are you having pain? No  PRECAUTIONS: Fall  RED FLAGS: None   WEIGHT BEARING RESTRICTIONS: No  FALLS: Has patient fallen in last 6 months? No  LIVING ENVIRONMENT: Lives with: lives with their spouse Lives in: House/apartment Stairs: No Has following equipment at home: None  PLOF: Independent -- plays golf, mow grass (uses push mower), preaching  PATIENT GOALS: Improve balance  OBJECTIVE: All findings are from initial evaluation unless otherwise  specified  DIAGNOSTIC FINDINGS: n/a  MUSCLE LENGTH: WNL  DTRs:  No issues  POSTURE: pelvis primarily rotated posteriorly in standing  LOWER EXTREMITY ROM:     Active  Right Eval Left Eval  Hip flexion    Hip extension    Hip abduction    Hip adduction    Hip internal rotation    Hip external rotation    Knee flexion    Knee extension    Ankle dorsiflexion    Ankle plantarflexion    Ankle inversion    Ankle eversion     (Blank rows = not tested)  LOWER EXTREMITY MMT:    MMT Right Eval Left Eval Right 08/15/23 Left 08/15/23 Right 08/17/23 Left 08/17/23  Hip flexion 4 4   4+ 4+  Hip extension 2+ 2+ 3- 3- 3- 3-  Hip abduction 3 3   4+ 4+  Hip adduction        Hip internal rotation        Hip external rotation        Knee flexion 4- 4-   4+ 4+  Knee extension 4+ 4+   4+ 4+  Ankle dorsiflexion     4+ 4-  Ankle plantarflexion 4+ 4+   4+ 4+  Ankle inversion        Ankle eversion        (Blank rows = not tested)  BED MOBILITY:  Independent throughout but slow due to arm sore from COVID and flu shot  STAIRS: 08/17/23 Level of Assistance: Modified independence Stair Negotiation Technique: now with Alternating Pattern  with Single Rail on Right Number of Stairs: 5  Height of Stairs: 6"  Comments: Slow no hip drop noted  GAIT: 08/17/23 Gait pattern: ataxic, lateral lean- Right, lateral lean- Left, and wide BOS Distance walked: In clinic Assistive device utilized: None Level of assistance: Complete Independence  FUNCTIONAL TESTS:  5x STS 08/17/23 10.36 sec from 11.68 sec Berg Balance Test: 08/17/23 47/56 from 44/56 DGI: 08/17/23 14 from 16/24  DGI 08/17/23 1. Gait level surface (2) Mild Impairment: Walks 20', uses assistive devices, slower speed, mild gait deviations. 2. Change in gait speed (2) Mild Impairment: Is able to change speed but demonstrates mild gait deviations, or not gait deviations but unable to achieve a significant change in velocity, or uses an  assistive device. 3. Gait with horizontal head turns (2) Mild Impairment: Performs head turns smoothly with slight change  in gait velocity, i.e., minor disruption to smooth gait path or uses walking aid. 4. Gait with vertical head turns (2) Mild Impairment: Performs head turns smoothly with slight change in gait velocity, i.e., minor disruption to smooth gait path or uses walking aid. 5. Gait and pivot turn (2) Mild Impairment: Pivot turns safely in > 3 seconds and stops with no loss of balance. 6. Step over obstacle (1) Moderate Impairment: Is able to step over box but must stop, then step over. May require verbal cueing. 7. Step around obstacles (1) Moderate Impairment: Is able to clear cones but must significantly slow, speed to accomplish task, or requires verbal cueing. 8. Stairs (2) Mild Impairment: Alternating feet, must use rail.  TOTAL SCORE: 14 / 24    PATIENT SURVEYS:  ABC scale 08/17/23: 66.3% Did not assess on eval  TODAY'S TREATMENT:                                                                                                                              DATE:  08/17/23 Progress note ( , DGI, Berg, 5TSTS, ABC scale)  08/15/23 Nustep L6 x 5 min UEs/LEs Leg press plate 4 double legs x10, single leg plate 3 X52 each Shoulder ext green TB for core engagement 2x10 Backwards lunge 2x10 with UE support. Required max verbal and tactile cueing Staggered stance balance 2x30" SLS 2x20" with intermittent UE support Tandem walking in // bars x3 laps Anti trunk rotation side step green TB 2x10  08/10/23 Nustep L6 x 5 min UEs/LEs only Leg press plate 4 double legs 2x10 Runner's step up 6" step 2x10 Side step up and over 6" step 2x10 On blue foam  Staggered stance chest press forward with red weighted ball 2x10 R&L  Feet together diagonals red weighted ball 2x10  Staggered stance with toe touch on 2" surface diagonals red weighted ball 2x10   08/08/23 Nustep L5 x 5 min  LEs only Side step green TB around knees 2x20' Forwards/backwards monster walk 2x20' Palloff press green TB with feet together 2x10 On blue foam  Staggered stance 2x30" R&L  Narrow feet diagonals red weighted ball x10, staggered stance x10 R&L  Staggered stance chest press forward with red weighted ball 2x10 R&L Chair Squat 2x10   08/01/23 Nustep L6 x 5 min UEs/LEs Runner's step up 6" step 2x10 with 1 UE support Side step 6" step 2x10 with intermittent 1 UE support On foam  Staggered stance press forward with 3# straight weight 2x10 R&L  Staggered stance head turns R&L 2x10  Slow marching with 3# straight weight 2x10  One foot on foam, other foot on 6" step diagonals with 3# straight weight 2x10    07/27/23 Nustep L5 x 5 min LEs only Sidelying hip abd 2x10 Prone hip ext knee flexed 2x10 Supine SLR 2x10 Supine bridge 2x10 Squat to chair 2x10 Forward lunge on 6" step no UE 2x10 On foam feet together eyes open 2x30"  On foam feet together head turns 2x10 On foam feet together head nods 2x10 On foam slow marching 2x10 with single UE support On foam heel raise 2x10   07/25/23 Hip abduction red TB around knees 2x10 Hip extension red TB 2x10 Marching alternating red TB 2 x 10 with 1 UE Squats to chair 2 x 10  Forward lunges onto 4" no UE for stabilization Tandem stance 3x30" Feet together head turns x10 Feet together head nods x10 Nustep L5 x 6 min LEs only ([place]) seat 9  07/21/23 Nustep L5 x 5 min UEs/LEs Hip abduction red TB around knees 2x10 Hip extension 2x10 Hip flexor stretch 2x30" without UE to also encourage balance Marching alternating around knees 2 x 10 without support Squats to chair 2 x 10  Tandem stance 3x30" Feet together head turns x10 Feet together head nods x10 Feet together on foam x30" Feet together on foam alternating UE raises 2x10  07/18/23 Review of HEP and goals Standing: Hip abduction 2 x 10 Hip extension 2 x 10 Tandem stance 3 x  20" Marching alternating 2 x 10 with 1 UE assist Squats to chair 2 x 10  4" box Step ups x 10 4" box lateral step ups x 10   07/15/23 See HEP below    PATIENT EDUCATION: Education details: Exam findings, POC, initial HEP Person educated: Patient Education method: Explanation, Demonstration, and Handouts Education comprehension: verbalized understanding, returned demonstration, and needs further education  HOME EXERCISE PROGRAM: Access Code: [email or web address] URL: [email or web address] Date: 08/10/2023 Prepared by: Vernon Prey April Kirstie Peri  Exercises - Standing Knee Flexion AROM with Chair Support  - 3 x daily - 7 x weekly - 1 sets - 10 reps - Squat with Chair Touch  - 3 x daily - 7 x weekly - 1 sets - 10 reps - Wide Tandem Stance on Foam Pad with Eyes Open  - 1 x daily - 7 x weekly - 3 sets - 10 reps - Side Stepping with Resistance at Thighs  - 1 x daily - 7 x weekly - 3 sets - 10 reps - Backward Walking with Counter Support  - 1 x daily - 7 x weekly - 3 sets - 10 reps - Forward Monster Walks  - 1 x daily - 7 x weekly - 3 sets - 10 reps - Runner's Step Up/Down  - 1 x daily - 7 x weekly - 3 sets - 10 reps  GOALS: Goals reviewed with patient? Yes  SHORT TERM GOALS: Target date: 08/12/2023   Pt will be ind with initial HEP Baseline: Goal status: MET  2.  Pt will demo at least 3/5 hip extension for improving single leg stability Baseline:  Goal status: in progress    LONG TERM GOALS: Target date: 09/14/2023   Pt will be ind with progression and advancement of HEP Baseline:  Goal status: in progress  2.  Pt will have improved Berg Balance Score to >/=52/56 to demo MCID Baseline: 44/56 08/17/23: 47 Goal status: in progress  3.  Pt will have improved DGI to >/=22/24 to demo decreased fall risk Baseline: 16/24 08/17/23: 14 Goal status: in progress  4.  Pt will be able to perform 5 steps with reciprocal pattern with no handrail to demo increased hip/knee strength  and stability Baseline:  Goal status: in progress  ASSESSMENT:  CLINICAL IMPRESSION: Patient demonstrated continued improvements in function as indicated by positive significant changes in strength. However, patient still presents with deficits in deficits  in balance as indicated in the DGI and Berg balance scores. ABC scale indicates that patient is not confident with his balance. With this, skilled PT is still required to address the impairments and functional limitations listed below.    Eval:Patient is a 77 y.o. M who was seen today for physical therapy evaluation and treatment for gait instability. Assessment significant for very weak proximal LE strength bilat leading to decreased stability and balance for steps, ambulating on even and uneven surfaces limiting community and recreational tasks. Pt's DGI and Berg Balance both indicate a high fall risk. Pt will benefit from PT to address these deficits for safe mobility.   OBJECTIVE IMPAIRMENTS: Abnormal gait, decreased activity tolerance, decreased balance, decreased mobility, difficulty walking, decreased ROM, decreased strength, improper body mechanics, and postural dysfunction.    PLAN:  PT FREQUENCY: 2x/week  PT DURATION: 4 weeks  PLANNED INTERVENTIONS: Therapeutic exercises, Therapeutic activity, Neuromuscular re-education, Balance training, Gait training, Patient/Family education, Self Care, Joint mobilization, Stair training, Electrical stimulation, Manual therapy, and Re-evaluation  PLAN FOR NEXT SESSION: Continue hip strengthening (hip hinging for gluteal activation), core strengthening and balance.    Tish Frederickson. Daniele Yankowski, PT, DPT, OCS Board-Certified Clinical Specialist in Orthopedic PT PT Compact Privilege # ([place]): [phone] T 1:14 PM, 08/17/23

## 2023-08-24 ENCOUNTER — Ambulatory Visit (HOSPITAL_COMMUNITY): Payer: Medicare HMO

## 2023-08-24 DIAGNOSIS — R2689 Other abnormalities of gait and mobility: Secondary | ICD-10-CM

## 2023-08-24 DIAGNOSIS — M6281 Muscle weakness (generalized): Secondary | ICD-10-CM

## 2023-08-24 DIAGNOSIS — R2681 Unsteadiness on feet: Secondary | ICD-10-CM

## 2023-08-24 NOTE — Therapy (Signed)
OUTPATIENT PHYSICAL THERAPY NEURO TREATMENT   Patient Name: Timothy Brennan MRN: [id number] DOB:[birth year], 77 y.o., male Today's Date: 08/24/2023   PCP: Benita Stabile, MD REFERRING PROVIDER: Benita Stabile, MD  END OF SESSION:  PT End of Session - 08/24/23 0847     Visit Number 11    Number of Visits 18    Date for PT Re-Evaluation 09/14/23    Authorization Type [employer]    Progress Note Due on Visit 18    PT Start Time 0845    PT Stop Time 0930    PT Time Calculation (min) 45 min    Activity Tolerance Patient tolerated treatment well    Behavior During Therapy [facility] for tasks assessed/performed            Past Medical History:  Diagnosis Date   Coronary artery disease    Diabetes mellitus    Hyperlipidemia    Hypertension    Past Surgical History:  Procedure Laterality Date   CARDIAC CATHETERIZATION  2007   with stent placement   COLONOSCOPY N/A 11/28/2019   Procedure: COLONOSCOPY;  Surgeon: Corbin Ade, MD;  Location: [facility];  Service: Endoscopy;  Laterality: N/A;  1:00   INGUINAL HERNIA REPAIR  11/15/2011   Procedure: HERNIA REPAIR INGUINAL ADULT;  Surgeon: Dalia Heading;  Location: [facility];  Service: General;  Laterality: Left;   INGUINAL HERNIA REPAIR Right 04/08/2021   Procedure: HERNIA REPAIR INGUINAL ADULT W/MESH;  Surgeon: Franky Macho, MD;  Location: [facility];  Service: General;  Laterality: Right;   MOUTH SURGERY     PARTIAL COLECTOMY N/A 12/12/2019   Procedure: PARTIAL COLECTOMY;  Surgeon: Franky Macho, MD;  Location: [facility];  Service: General;  Laterality: N/A;   POLYPECTOMY  11/28/2019   Procedure: POLYPECTOMY;  Surgeon: Corbin Ade, MD;  Location: [facility];  Service: Endoscopy;;   ROTATOR CUFF REPAIR     left   Patient Active Problem List   Diagnosis Date Noted   Pain due to onychomycosis of toenails of both feet 03/09/2023   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   Right inguinal hernia    S/P partial colectomy 12/12/2019   Dysplastic polyp  of colon    Encounter for screening colonoscopy 08/06/2019   Essential hypertension 08/06/2019   Hyperlipidemia 08/06/2019   Diabetes mellitus without complication (HCC) 05/24/2009   CAD, UNSPECIFIED SITE 05/24/2009   DYSPNEA ON EXERTION 05/24/2009    ONSET DATE: Chronic  REFERRING DIAG: R26.89 (ICD-10-CM) - Other abnormalities of gait and mobility  THERAPY DIAG:  Muscle weakness (generalized)  Unsteadiness on feet  Other abnormalities of gait and mobility  Rationale for Evaluation and Treatment: Rehabilitation  SUBJECTIVE:  SUBJECTIVE STATEMENT: Doing well today and denies falls.  PROGRESS NOTE 08/17/23: Patient states that his balance has gotten better such that he has improved around 40%. Patient reports that he still has difficulty going upstairs and needs to hold on to something for support after 5-10 minutes of standing. Patient is unsure if he needs to continue with PT or not.  EVAL:Pt states he's here for balance. Pt reports he is stumbling and wobbly. Reports multiple near falls. States he can get up and feel wobbly. Pt reports he has to use rail when going up/down steps. "Legs feel like lead" Pt accompanied by: self  PERTINENT HISTORY: n/a  PAIN:  Are you having pain? No  PRECAUTIONS: Fall  RED FLAGS: None   WEIGHT BEARING RESTRICTIONS: No  FALLS: Has patient fallen in last 6 months? No  LIVING ENVIRONMENT: Lives with: lives with their spouse Lives in: House/apartment Stairs: No Has following equipment at home: None  PLOF: Independent -- plays golf, mow grass (uses push mower), preaching  PATIENT GOALS: Improve balance  OBJECTIVE: All findings are from initial evaluation unless otherwise specified  DIAGNOSTIC FINDINGS: n/a  MUSCLE LENGTH: WNL  DTRs:  No  issues  POSTURE: pelvis primarily rotated posteriorly in standing  LOWER EXTREMITY ROM:     Active  Right Eval Left Eval  Hip flexion    Hip extension    Hip abduction    Hip adduction    Hip internal rotation    Hip external rotation    Knee flexion    Knee extension    Ankle dorsiflexion    Ankle plantarflexion    Ankle inversion    Ankle eversion     (Blank rows = not tested)  LOWER EXTREMITY MMT:    MMT Right Eval Left Eval Right 08/15/23 Left 08/15/23 Right 08/17/23 Left 08/17/23  Hip flexion 4 4   4+ 4+  Hip extension 2+ 2+ 3- 3- 3- 3-  Hip abduction 3 3   4+ 4+  Hip adduction        Hip internal rotation        Hip external rotation        Knee flexion 4- 4-   4+ 4+  Knee extension 4+ 4+   4+ 4+  Ankle dorsiflexion     4+ 4-  Ankle plantarflexion 4+ 4+   4+ 4+  Ankle inversion        Ankle eversion        (Blank rows = not tested)  BED MOBILITY:  Independent throughout but slow due to arm sore from COVID and flu shot  STAIRS: 08/17/23 Level of Assistance: Modified independence Stair Negotiation Technique: now with Alternating Pattern  with Single Rail on Right Number of Stairs: 5  Height of Stairs: 6"  Comments: Slow no hip drop noted  GAIT: 08/17/23 Gait pattern: ataxic, lateral lean- Right, lateral lean- Left, and wide BOS Distance walked: In clinic Assistive device utilized: None Level of assistance: Complete Independence  FUNCTIONAL TESTS:  5x STS 08/17/23 10.36 sec from 11.68 sec Berg Balance Test: 08/17/23 47/56 from 44/56 DGI: 08/17/23 14 from 16/24  DGI 08/17/23 1. Gait level surface (2) Mild Impairment: Walks 20', uses assistive devices, slower speed, mild gait deviations. 2. Change in gait speed (2) Mild Impairment: Is able to change speed but demonstrates mild gait deviations, or not gait deviations but unable to achieve a significant change in velocity, or uses an assistive device. 3. Gait with horizontal head turns (2)  Mild  Impairment: Performs head turns smoothly with slight change in gait velocity, i.e., minor disruption to smooth gait path or uses walking aid. 4. Gait with vertical head turns (2) Mild Impairment: Performs head turns smoothly with slight change in gait velocity, i.e., minor disruption to smooth gait path or uses walking aid. 5. Gait and pivot turn (2) Mild Impairment: Pivot turns safely in > 3 seconds and stops with no loss of balance. 6. Step over obstacle (1) Moderate Impairment: Is able to step over box but must stop, then step over. May require verbal cueing. 7. Step around obstacles (1) Moderate Impairment: Is able to clear cones but must significantly slow, speed to accomplish task, or requires verbal cueing. 8. Stairs (2) Mild Impairment: Alternating feet, must use rail.  TOTAL SCORE: 14 / 24    PATIENT SURVEYS:  ABC scale 08/17/23: 66.3% Did not assess on eval  TODAY'S TREATMENT:                                                                                                                              DATE:  08/24/23 NuStep, level 6, seat 10, arm 7, x 5' Gastrocenmius slant board stretch x 30" x 3 Double leg press, 4 plates, 3" x 10 x 2 Single leg press, 3 plates x 3" x 10 each leg Forward stepping over orange cones x 5 rounds Side stepping over orange cones (4 cones) x 5 rounds Step ups on a 7" stair x 10 x 2 Bodycraft standing TKE x 2 plates x 3" x 10 each Bodycraft resisted walking forward/backward x 2 plates x 2 rounds each  08/17/23 Progress note ( , DGI, Berg, 5TSTS, ABC scale)  08/15/23 Nustep L6 x 5 min UEs/LEs Leg press plate 4 double legs x10, single leg plate 3 Z61 each Shoulder ext green TB for core engagement 2x10 Backwards lunge 2x10 with UE support. Required max verbal and tactile cueing Staggered stance balance 2x30" SLS 2x20" with intermittent UE support Tandem walking in // bars x3 laps Anti trunk rotation side step green TB  2x10  08/10/23 Nustep L6 x 5 min UEs/LEs only Leg press plate 4 double legs 2x10 Runner's step up 6" step 2x10 Side step up and over 6" step 2x10 On blue foam  Staggered stance chest press forward with red weighted ball 2x10 R&L  Feet together diagonals red weighted ball 2x10  Staggered stance with toe touch on 2" surface diagonals red weighted ball 2x10   08/08/23 Nustep L5 x 5 min LEs only Side step green TB around knees 2x20' Forwards/backwards monster walk 2x20' Palloff press green TB with feet together 2x10 On blue foam  Staggered stance 2x30" R&L  Narrow feet diagonals red weighted ball x10, staggered stance x10 R&L  Staggered stance chest press forward with red weighted ball 2x10 R&L Chair Squat 2x10   08/01/23 Nustep L6 x 5 min UEs/LEs Runner's step up 6" step 2x10 with 1 UE support  Side step 6" step 2x10 with intermittent 1 UE support On foam  Staggered stance press forward with 3# straight weight 2x10 R&L  Staggered stance head turns R&L 2x10  Slow marching with 3# straight weight 2x10  One foot on foam, other foot on 6" step diagonals with 3# straight weight 2x10    07/27/23 Nustep L5 x 5 min LEs only Sidelying hip abd 2x10 Prone hip ext knee flexed 2x10 Supine SLR 2x10 Supine bridge 2x10 Squat to chair 2x10 Forward lunge on 6" step no UE 2x10 On foam feet together eyes open 2x30" On foam feet together head turns 2x10 On foam feet together head nods 2x10 On foam slow marching 2x10 with single UE support On foam heel raise 2x10   07/25/23 Hip abduction red TB around knees 2x10 Hip extension red TB 2x10 Marching alternating red TB 2 x 10 with 1 UE Squats to chair 2 x 10  Forward lunges onto 4" no UE for stabilization Tandem stance 3x30" Feet together head turns x10 Feet together head nods x10 Nustep L5 x 6 min LEs only ([place]) seat 9  07/21/23 Nustep L5 x 5 min UEs/LEs Hip abduction red TB around knees 2x10 Hip extension 2x10 Hip flexor stretch  2x30" without UE to also encourage balance Marching alternating around knees 2 x 10 without support Squats to chair 2 x 10  Tandem stance 3x30" Feet together head turns x10 Feet together head nods x10 Feet together on foam x30" Feet together on foam alternating UE raises 2x10  07/18/23 Review of HEP and goals Standing: Hip abduction 2 x 10 Hip extension 2 x 10 Tandem stance 3 x 20" Marching alternating 2 x 10 with 1 UE assist Squats to chair 2 x 10  4" box Step ups x 10 4" box lateral step ups x 10   07/15/23 See HEP below    PATIENT EDUCATION: Education details: Exam findings, POC, initial HEP Person educated: Patient Education method: Explanation, Demonstration, and Handouts Education comprehension: verbalized understanding, returned demonstration, and needs further education  HOME EXERCISE PROGRAM: Access Code: [email or web address] URL: [email or web address] Date: 08/10/2023 Prepared by: Vernon Prey April Kirstie Peri  Exercises - Standing Knee Flexion AROM with Chair Support  - 3 x daily - 7 x weekly - 1 sets - 10 reps - Squat with Chair Touch  - 3 x daily - 7 x weekly - 1 sets - 10 reps - Wide Tandem Stance on Foam Pad with Eyes Open  - 1 x daily - 7 x weekly - 3 sets - 10 reps - Side Stepping with Resistance at Thighs  - 1 x daily - 7 x weekly - 3 sets - 10 reps - Backward Walking with Counter Support  - 1 x daily - 7 x weekly - 3 sets - 10 reps - Forward Monster Walks  - 1 x daily - 7 x weekly - 3 sets - 10 reps - Runner's Step Up/Down  - 1 x daily - 7 x weekly - 3 sets - 10 reps  GOALS: Goals reviewed with patient? Yes  SHORT TERM GOALS: Target date: 08/12/2023   Pt will be ind with initial HEP Baseline: Goal status: MET  2.  Pt will demo at least 3/5 hip extension for improving single leg stability Baseline:  Goal status: in progress    LONG TERM GOALS: Target date: 09/14/2023   Pt will be ind with progression and advancement of HEP Baseline:  Goal  status: in progress  2.  Pt will have improved Berg Balance Score to >/=52/56 to demo MCID Baseline: 44/56 08/17/23: 47 Goal status: in progress  3.  Pt will have improved DGI to >/=22/24 to demo decreased fall risk Baseline: 16/24 08/17/23: 14 Goal status: in progress  4.  Pt will be able to perform 5 steps with reciprocal pattern with no handrail to demo increased hip/knee strength and stability Baseline:  Goal status: in progress  ASSESSMENT:  CLINICAL IMPRESSION: Interventions today were geared towards LE strengthening, flexibility, and balance. Demonstrated mod unsteadiness with the step ups, stepping on obstacles and resisted walking due to impaired proprioception. Demonstrated appropriate levels of fatigue. Rest periods provided. Provided mod amount of cueing to ensure correct execution of activity with fair carry-over. To date, skilled PT is required to address the impairments and improve function.  PROGRESS NOTE 08/24/23: Patient demonstrated continued improvements in function as indicated by positive significant changes in strength. However, patient still presents with deficits in deficits in balance as indicated in the DGI and Berg balance scores. ABC scale indicates that patient is not confident with his balance. With this, skilled PT is still required to address the impairments and functional limitations listed below.    Eval:Patient is a 77 y.o. M who was seen today for physical therapy evaluation and treatment for gait instability. Assessment significant for very weak proximal LE strength bilat leading to decreased stability and balance for steps, ambulating on even and uneven surfaces limiting community and recreational tasks. Pt's DGI and Berg Balance both indicate a high fall risk. Pt will benefit from PT to address these deficits for safe mobility.   OBJECTIVE IMPAIRMENTS: Abnormal gait, decreased activity tolerance, decreased balance, decreased mobility, difficulty walking,  decreased ROM, decreased strength, improper body mechanics, and postural dysfunction.    PLAN:  PT FREQUENCY: 2x/week  PT DURATION: 4 weeks  PLANNED INTERVENTIONS: Therapeutic exercises, Therapeutic activity, Neuromuscular re-education, Balance training, Gait training, Patient/Family education, Self Care, Joint mobilization, Stair training, Electrical stimulation, Manual therapy, and Re-evaluation  PLAN FOR NEXT SESSION: Continue hip strengthening (hip hinging for gluteal activation), core strengthening and balance.    Tish Frederickson. Lyell Clugston, PT, DPT, OCS Board-Certified Clinical Specialist in Orthopedic PT PT Compact Privilege # ([place]): [phone] T 9:33 AM, 08/24/23

## 2023-08-29 ENCOUNTER — Ambulatory Visit (HOSPITAL_COMMUNITY): Payer: Medicare HMO | Admitting: Physical Therapy

## 2023-08-29 DIAGNOSIS — M6281 Muscle weakness (generalized): Secondary | ICD-10-CM

## 2023-08-29 DIAGNOSIS — R2681 Unsteadiness on feet: Secondary | ICD-10-CM

## 2023-08-29 DIAGNOSIS — R2689 Other abnormalities of gait and mobility: Secondary | ICD-10-CM

## 2023-08-29 NOTE — Therapy (Signed)
OUTPATIENT PHYSICAL THERAPY NEURO TREATMENT   Patient Name: Timothy Brennan MRN: [id number] DOB:May 05, 1946, 77 y.o., male Today's Date: 08/29/2023   PCP: Benita Stabile, MD REFERRING PROVIDER: Benita Stabile, MD  END OF SESSION:  PT End of Session - 08/29/23 0932     Visit Number 12    Number of Visits 18    Date for PT Re-Evaluation 09/14/23    Authorization Type [employer]    Progress Note Due on Visit 18    PT Start Time 0852    PT Stop Time 0935    PT Time Calculation (min) 43 min    Activity Tolerance Patient tolerated treatment well    Behavior During Therapy [facility] for tasks assessed/performed             Past Medical History:  Diagnosis Date   Coronary artery disease    Diabetes mellitus    Hyperlipidemia    Hypertension    Past Surgical History:  Procedure Laterality Date   CARDIAC CATHETERIZATION  2007   with stent placement   COLONOSCOPY N/A 11/28/2019   Procedure: COLONOSCOPY;  Surgeon: Corbin Ade, MD;  [facility];  Service: Endoscopy;  Laterality: N/A;  1:00   INGUINAL HERNIA REPAIR  11/15/2011   Procedure: HERNIA REPAIR INGUINAL ADULT;  Surgeon: Dalia Heading;  Location: [facility] OR[facility];  Service: General;  Laterality: Left;   INGUINAL HERNIA REPAIR Right 04/08/2021   Procedure: HERNIA REPAIR INGUINAL ADULT W/MESH;  Surgeon: Franky Macho, MD;  Location: [facility];  Service: General;  Laterality: Right;   MOUTH SURGERY     PARTIAL COLECTOMY N/A 12/12/2019   Procedure: PARTIAL COLECTOMY;  Surgeon: Franky Macho, MD;  Location: [facility];  Service: General;  Laterality: N/A;   POLYPECTOMY  11/28/2019   Procedure: POLYPECTOMY;  Surgeon: Corbin Ade, MD;  Location: [facility];  Service: Endoscopy;;   ROTATOR CUFF REPAIR     left   Patient Active Problem List   Diagnosis Date Noted   Pain due to onychomycosis of toenails of both feet 03/09/2023   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   Right inguinal hernia    S/P partial colectomy 12/12/2019   Dysplastic  polyp of colon    Encounter for screening colonoscopy 08/06/2019   Essential hypertension 08/06/2019   Hyperlipidemia 08/06/2019   Diabetes mellitus without complication (HCC) 05/24/2009   CAD, UNSPECIFIED SITE 05/24/2009   DYSPNEA ON EXERTION 05/24/2009    ONSET DATE: Chronic  REFERRING DIAG: R26.89 (ICD-10-CM) - Other abnormalities of gait and mobility  THERAPY DIAG:  No diagnosis found.  Rationale for Evaluation and Treatment: Rehabilitation  SUBJECTIVE:  SUBJECTIVE STATEMENT: Doing well today and doing his HEP.  PROGRESS NOTE 08/17/23: Patient states that his balance has gotten better such that he has improved around 40%. Patient reports that he still has difficulty going upstairs and needs to hold on to something for support after 5-10 minutes of standing. Patient is unsure if he needs to continue with PT or not.  EVAL:Pt states he's here for balance. Pt reports he is stumbling and wobbly. Reports multiple near falls. States he can get up and feel wobbly. Pt reports he has to use rail when going up/down steps. "Legs feel like lead" Pt accompanied by: self  PERTINENT HISTORY: n/a  PAIN:  Are you having pain? No  PRECAUTIONS: Fall  RED FLAGS: None   WEIGHT BEARING RESTRICTIONS: No  FALLS: Has patient fallen in last 6 months? No  LIVING ENVIRONMENT: Lives with: lives with their spouse Lives in: House/apartment Stairs: No Has following equipment at home: None  PLOF: Independent -- plays golf, mow grass (uses push mower), preaching  PATIENT GOALS: Improve balance  OBJECTIVE: All findings are from initial evaluation unless otherwise specified  DIAGNOSTIC FINDINGS: n/a  MUSCLE LENGTH: WNL  DTRs:  No issues  POSTURE: pelvis primarily rotated posteriorly in standing     LOWER EXTREMITY MMT:    MMT Right Eval Left Eval Right 08/15/23 Left 08/15/23 Right 08/17/23 Left 08/17/23  Hip flexion 4 4   4+ 4+  Hip extension 2+ 2+ 3- 3- 3- 3-  Hip abduction 3 3   4+ 4+  Hip adduction        Hip internal rotation        Hip external rotation        Knee flexion 4- 4-   4+ 4+  Knee extension 4+ 4+   4+ 4+  Ankle dorsiflexion     4+ 4-  Ankle plantarflexion 4+ 4+   4+ 4+  Ankle inversion        Ankle eversion        (Blank rows = not tested)  BED MOBILITY:  Independent throughout but slow due to arm sore from COVID and flu shot  STAIRS: 08/17/23 Level of Assistance: Modified independence Stair Negotiation Technique: now with Alternating Pattern  with Single Rail on Right Number of Stairs: 5  Height of Stairs: 6"  Comments: Slow no hip drop noted  GAIT: 08/17/23 Gait pattern: ataxic, lateral lean- Right, lateral lean- Left, and wide BOS Distance walked: In clinic Assistive device utilized: None Level of assistance: Complete Independence  FUNCTIONAL TESTS:  5x STS 08/17/23 10.36 sec from 11.68 sec Berg Balance Test: 08/17/23 47/56 from 44/56 DGI: 08/17/23 14 from 16/24  DGI 08/17/23 1. Gait level surface (2) Mild Impairment: Walks 20', uses assistive devices, slower speed, mild gait deviations. 2. Change in gait speed (2) Mild Impairment: Is able to change speed but demonstrates mild gait deviations, or not gait deviations but unable to achieve a significant change in velocity, or uses an assistive device. 3. Gait with horizontal head turns (2) Mild Impairment: Performs head turns smoothly with slight change in gait velocity, i.e., minor disruption to smooth gait path or uses walking aid. 4. Gait with vertical head turns (2) Mild Impairment: Performs head turns smoothly with slight change in gait velocity, i.e., minor disruption to smooth gait path or uses walking aid. 5. Gait and pivot turn (2) Mild Impairment: Pivot turns safely in > 3 seconds and  stops with no loss of balance. 6. Step over obstacle (  1) Moderate Impairment: Is able to step over box but must stop, then step over. May require verbal cueing. 7. Step around obstacles (1) Moderate Impairment: Is able to clear cones but must significantly slow, speed to accomplish task, or requires verbal cueing. 8. Stairs (2) Mild Impairment: Alternating feet, must use rail.  TOTAL SCORE: 14 / 24    PATIENT SURVEYS:  ABC scale 08/17/23: 66.3% Did not assess on eval  TODAY'S TREATMENT:                                                                                                                              DATE:  08/29/23 NuStep, level 6, seat 10, arm 7, x 5' Gastrocenmius slant board stretch x 30" x 3 Double leg press, 4 plates, 3" x 10 x 2 Single leg press, 3 plates x 3" x 10 each leg Step ups on a 7" stair x 10 x 2 bil UE assist Lateral step ups with 7" step 10X2 bil UE assist Bodycraft resisted walking forward/backward 2 plates x5 each way Bodycraft resisted walking Right and Left laterals 2 plates x5 each way  08/24/23 NuStep, level 6, seat 10, arm 7, x 5' Gastrocenmius slant board stretch x 30" x 3 Double leg press, 4 plates, 3" x 10 x 2 Single leg press, 3 plates x 3" x 10 each leg Forward stepping over orange cones x 5 rounds Side stepping over orange cones (4 cones) x 5 rounds Step ups on a 7" stair x 10 x 2 Bodycraft standing TKE x 2 plates x 3" x 10 each Bodycraft resisted walking forward/backward x 2 plates x 2 rounds each  08/17/23 Progress note ( , DGI, Berg, 5TSTS, ABC scale)  08/15/23 Nustep L6 x 5 min UEs/LEs Leg press plate 4 double legs x10, single leg plate 3 O96 each Shoulder ext green TB for core engagement 2x10 Backwards lunge 2x10 with UE support. Required max verbal and tactile cueing Staggered stance balance 2x30" SLS 2x20" with intermittent UE support Tandem walking in // bars x3 laps Anti trunk rotation side step green TB  2x10    PATIENT EDUCATION: Education details: Exam findings, POC, initial HEP Person educated: Patient Education method: Explanation, Demonstration, and Handouts Education comprehension: verbalized understanding, returned demonstration, and needs further education  HOME EXERCISE PROGRAM: Access Code: [email or web address] URL: [email or web address] Date: 08/10/2023 Prepared by: Vernon Prey April Kirstie Peri  Exercises - Standing Knee Flexion AROM with Chair Support  - 3 x daily - 7 x weekly - 1 sets - 10 reps - Squat with Chair Touch  - 3 x daily - 7 x weekly - 1 sets - 10 reps - Wide Tandem Stance on Foam Pad with Eyes Open  - 1 x daily - 7 x weekly - 3 sets - 10 reps - Side Stepping with Resistance at Thighs  - 1 x daily - 7 x weekly - 3 sets - 10 reps - Backward Walking with [employer] Support  -  1 x daily - 7 x weekly - 3 sets - 10 reps - Forward Monster Walks  - 1 x daily - 7 x weekly - 3 sets - 10 reps - Runner's Step Up/Down  - 1 x daily - 7 x weekly - 3 sets - 10 reps  GOALS: Goals reviewed with patient? Yes  SHORT TERM GOALS: Target date: 08/12/2023   Pt will be ind with initial HEP Baseline: Goal status: MET  2.  Pt will demo at least 3/5 hip extension for improving single leg stability Baseline:  Goal status: in progress    LONG TERM GOALS: Target date: 09/14/2023   Pt will be ind with progression and advancement of HEP Baseline:  Goal status: in progress  2.  Pt will have improved Berg Balance Score to >/=52/56 to demo MCID Baseline: 44/56 08/17/23: 47 Goal status: in progress  3.  Pt will have improved DGI to >/=22/24 to demo decreased fall risk Baseline: 16/24 08/17/23: 14 Goal status: in progress  4.  Pt will be able to perform 5 steps with reciprocal pattern with no handrail to demo increased hip/knee strength and stability Baseline:  Goal status: in progress  ASSESSMENT:  CLINICAL IMPRESSION: Continued with focus on improving LE strength and  balance. Pt continues to require min cues and assistance with more advanced tasks and obstacles, however much improved strength and stability.  Verbal cues for hold times with stretches.  Added lateral step outs with resistance on bodycraft noting good challenge.  Pt did not require rest, however reported overall muscular fatigue at end of session.  Pt will continue to benefit from skilled therapy to improve deficits and improve function.  PROGRESS NOTE 08/24/23: Patient demonstrated continued improvements in function as indicated by positive significant changes in strength. However, patient still presents with deficits in deficits in balance as indicated in the DGI and Berg balance scores. ABC scale indicates that patient is not confident with his balance. With this, skilled PT is still required to address the impairments and functional limitations listed below.    Eval:Patient is a 77 y.o. M who was seen today for physical therapy evaluation and treatment for gait instability. Assessment significant for very weak proximal LE strength bilat leading to decreased stability and balance for steps, ambulating on even and uneven surfaces limiting community and recreational tasks. Pt's DGI and Berg Balance both indicate a high fall risk. Pt will benefit from PT to address these deficits for safe mobility.   OBJECTIVE IMPAIRMENTS: Abnormal gait, decreased activity tolerance, decreased balance, decreased mobility, difficulty walking, decreased ROM, decreased strength, improper body mechanics, and postural dysfunction.    PLAN:  PT FREQUENCY: 2x/week  PT DURATION: 4 weeks  PLANNED INTERVENTIONS: Therapeutic exercises, Therapeutic activity, Neuromuscular re-education, Balance training, Gait training, Patient/Family education, Self Care, Joint mobilization, Stair training, Electrical stimulation, Manual therapy, and Re-evaluation  PLAN FOR NEXT SESSION: Continue hip strengthening.  Next session add hip hinging  for gluteal activation, vectors.  Continue to improve core strength and balance.    Lurena Nida, PTA/CLT [facility] Ph: [phone]  9:33 AM, 08/29/23

## 2023-08-31 ENCOUNTER — Ambulatory Visit (HOSPITAL_COMMUNITY): Payer: Medicare HMO | Admitting: Physical Therapy

## 2023-08-31 DIAGNOSIS — M6281 Muscle weakness (generalized): Secondary | ICD-10-CM | POA: Diagnosis not present

## 2023-08-31 DIAGNOSIS — R2689 Other abnormalities of gait and mobility: Secondary | ICD-10-CM

## 2023-08-31 DIAGNOSIS — R2681 Unsteadiness on feet: Secondary | ICD-10-CM

## 2023-08-31 NOTE — Therapy (Signed)
OUTPATIENT PHYSICAL THERAPY NEURO TREATMENT   Patient Name: Timothy Brennan MRN: [id number] DOB:02-Feb-1946, 77 y.o., male Today's Date: 08/31/2023   PCP: Benita Stabile, MD REFERRING PROVIDER: Benita Stabile, MD  END OF SESSION:  PT End of Session - 08/31/23 0813     Visit Number 13    Number of Visits 18    Date for PT Re-Evaluation 09/14/23    Authorization Type [employer] Medicare    Progress Note Due on Visit 18    PT Start Time 0803    PT Stop Time 0845    PT Time Calculation (min) 42 min    Activity Tolerance Patient tolerated treatment well    Behavior During Therapy [facility] for tasks assessed/performed             Past Medical History:  Diagnosis Date   Coronary artery disease    Diabetes mellitus    Hyperlipidemia    Hypertension    Past Surgical History:  Procedure Laterality Date   CARDIAC CATHETERIZATION  2007   with stent placement   COLONOSCOPY N/A 11/28/2019   Procedure: COLONOSCOPY;  Surgeon: Corbin Ade, MD;  [facility];  Service: Endoscopy;  Laterality: N/A;  1:00   INGUINAL HERNIA REPAIR  11/15/2011   Procedure: HERNIA REPAIR INGUINAL ADULT;  Surgeon: Dalia Heading;  Location: [facility];  Service: General;  Laterality: Left;   INGUINAL HERNIA REPAIR Right 04/08/2021   Procedure: HERNIA REPAIR INGUINAL ADULT W/MESH;  Surgeon: Franky Macho, MD;  Location: [facility];  Service: General;  Laterality: Right;   MOUTH SURGERY     PARTIAL COLECTOMY N/A 12/12/2019   Procedure: PARTIAL COLECTOMY;  Surgeon: Franky Macho, MD;  Location: [facility];  Service: General;  Laterality: N/A;   POLYPECTOMY  11/28/2019   Procedure: POLYPECTOMY;  Surgeon: Corbin Ade, MD;  [facility];  Service: Endoscopy;;   ROTATOR CUFF REPAIR     left   Patient Active Problem List   Diagnosis Date Noted   Pain due to onychomycosis of toenails of both feet 03/09/2023   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   Right inguinal hernia    S/P partial colectomy 12/12/2019   Dysplastic  polyp of colon    Encounter for screening colonoscopy 08/06/2019   Essential hypertension 08/06/2019   Hyperlipidemia 08/06/2019   Diabetes mellitus without complication (HCC) 05/24/2009   CAD, UNSPECIFIED SITE 05/24/2009   DYSPNEA ON EXERTION 05/24/2009    ONSET DATE: Chronic  REFERRING DIAG: R26.89 (ICD-10-CM) - Other abnormalities of gait and mobility  THERAPY DIAG:  No diagnosis found.  Rationale for Evaluation and Treatment: Rehabilitation  SUBJECTIVE:  SUBJECTIVE STATEMENT: Pt reports no pain or issues.    PROGRESS NOTE 08/17/23: Patient states that his balance has gotten better such that he has improved around 40%. Patient reports that he still has difficulty going upstairs and needs to hold on to something for support after 5-10 minutes of standing. Patient is unsure if he needs to continue with PT or not.  EVAL:Pt states he's here for balance. Pt reports he is stumbling and wobbly. Reports multiple near falls. States he can get up and feel wobbly. Pt reports he has to use rail when going up/down steps. "Legs feel like lead" Pt accompanied by: self  PERTINENT HISTORY: n/a  PAIN:  Are you having pain? No  PRECAUTIONS: Fall  RED FLAGS: None   WEIGHT BEARING RESTRICTIONS: No  FALLS: Has patient fallen in last 6 months? No  LIVING ENVIRONMENT: Lives with: lives with their spouse Lives in: House/apartment Stairs: No Has following equipment at home: None  PLOF: Independent -- plays golf, mow grass (uses push mower), preaching  PATIENT GOALS: Improve balance  OBJECTIVE: All findings are from initial evaluation unless otherwise specified  DIAGNOSTIC FINDINGS: n/a  MUSCLE LENGTH: WNL  DTRs:  No issues  POSTURE: pelvis primarily rotated posteriorly in standing    LOWER  EXTREMITY MMT:    MMT Right Eval Left Eval Right 08/15/23 Left 08/15/23 Right 08/17/23 Left 08/17/23  Hip flexion 4 4   4+ 4+  Hip extension 2+ 2+ 3- 3- 3- 3-  Hip abduction 3 3   4+ 4+  Hip adduction        Hip internal rotation        Hip external rotation        Knee flexion 4- 4-   4+ 4+  Knee extension 4+ 4+   4+ 4+  Ankle dorsiflexion     4+ 4-  Ankle plantarflexion 4+ 4+   4+ 4+  Ankle inversion        Ankle eversion        (Blank rows = not tested)  BED MOBILITY:  Independent throughout but slow due to arm sore from COVID and flu shot  STAIRS: 08/17/23 Level of Assistance: Modified independence Stair Negotiation Technique: now with Alternating Pattern  with Single Rail on Right Number of Stairs: 5  Height of Stairs: 6"  Comments: Slow no hip drop noted  GAIT: 08/17/23 Gait pattern: ataxic, lateral lean- Right, lateral lean- Left, and wide BOS Distance walked: In clinic Assistive device utilized: None Level of assistance: Complete Independence  FUNCTIONAL TESTS:  5x STS 08/17/23 10.36 sec from 11.68 sec Berg Balance Test: 08/17/23 47/56 from 44/56 DGI: 08/17/23 14 from 16/24  DGI 08/17/23 1. Gait level surface (2) Mild Impairment: Walks 20', uses assistive devices, slower speed, mild gait deviations. 2. Change in gait speed (2) Mild Impairment: Is able to change speed but demonstrates mild gait deviations, or not gait deviations but unable to achieve a significant change in velocity, or uses an assistive device. 3. Gait with horizontal head turns (2) Mild Impairment: Performs head turns smoothly with slight change in gait velocity, i.e., minor disruption to smooth gait path or uses walking aid. 4. Gait with vertical head turns (2) Mild Impairment: Performs head turns smoothly with slight change in gait velocity, i.e., minor disruption to smooth gait path or uses walking aid. 5. Gait and pivot turn (2) Mild Impairment: Pivot turns safely in > 3 seconds and stops with  no loss of balance. 6. Step over  obstacle (1) Moderate Impairment: Is able to step over box but must stop, then step over. May require verbal cueing. 7. Step around obstacles (1) Moderate Impairment: Is able to clear cones but must significantly slow, speed to accomplish task, or requires verbal cueing. 8. Stairs (2) Mild Impairment: Alternating feet, must use rail.  TOTAL SCORE: 14 / 24    PATIENT SURVEYS:  ABC scale 08/17/23: 66.3% Did not assess on eval  TODAY'S TREATMENT:                                                                                                                              DATE:  08/31/23 NuStep, level 6, seat 10, arm 7, x 5' Heelraise incline, toeraise decline 20X each Gastrocenmius slant board stretch x 30" x 3 Double leg press, 4 plates, 3" x 10 x 2 Single leg press, 3 plates x 3" x 10 each leg Bodycraft resisted walking forward/backward 3 plates x5 each way Bodycraft resisted walking Right and Left laterals 3 plates x5 each way Hip hikes on step with opposite LE dangling 10X each Vectors 10X3" each with 1 UE assist  08/29/23 NuStep, level 6, seat 10, arm 7, x 5' Gastrocenmius slant board stretch x 30" x 3 Double leg press, 4 plates, 3" x 10 x 2 Single leg press, 3 plates x 3" x 10 each leg Step ups on a 7" stair x 10 x 2 bil UE assist Lateral step ups with 7" step 10X2 bil UE assist Bodycraft resisted walking forward/backward 2 plates x5 each way Bodycraft resisted walking Right and Left laterals 2 plates x5 each way  08/24/23 NuStep, level 6, seat 10, arm 7, x 5' Gastrocenmius slant board stretch x 30" x 3 Double leg press, 4 plates, 3" x 10 x 2 Single leg press, 3 plates x 3" x 10 each leg Forward stepping over orange cones x 5 rounds Side stepping over orange cones (4 cones) x 5 rounds Step ups on a 7" stair x 10 x 2 Bodycraft standing TKE x 2 plates x 3" x 10 each Bodycraft resisted walking forward/backward x 2 plates x 2 rounds  each  08/17/23 Progress note ( , DGI, Berg, 5TSTS, ABC scale)    PATIENT EDUCATION: Education details: Exam findings, POC, initial HEP Person educated: Patient Education method: Explanation, Demonstration, and Handouts Education comprehension: verbalized understanding, returned demonstration, and needs further education  HOME EXERCISE PROGRAM: Access Code: [email or web address] URL: [email or web address] Date: 08/10/2023 Prepared by: Vernon Prey April Kirstie Peri  Exercises - Standing Knee Flexion AROM with Chair Support  - 3 x daily - 7 x weekly - 1 sets - 10 reps - Squat with Chair Touch  - 3 x daily - 7 x weekly - 1 sets - 10 reps - Wide Tandem Stance on Foam Pad with Eyes Open  - 1 x daily - 7 x weekly - 3 sets - 10 reps - Side Stepping with Resistance at Thighs  -  1 x daily - 7 x weekly - 3 sets - 10 reps - Backward Walking with Counter Support  - 1 x daily - 7 x weekly - 3 sets - 10 reps - Forward Monster Walks  - 1 x daily - 7 x weekly - 3 sets - 10 reps - Runner's Step Up/Down  - 1 x daily - 7 x weekly - 3 sets - 10 reps  GOALS: Goals reviewed with patient? Yes  SHORT TERM GOALS: Target date: 08/12/2023   Pt will be ind with initial HEP Baseline: Goal status: MET  2.  Pt will demo at least 3/5 hip extension for improving single leg stability Baseline:  Goal status: in progress    LONG TERM GOALS: Target date: 09/14/2023   Pt will be ind with progression and advancement of HEP Baseline:  Goal status: in progress  2.  Pt will have improved Berg Balance Score to >/=52/56 to demo MCID Baseline: 44/56 08/17/23: 47 Goal status: in progress  3.  Pt will have improved DGI to >/=22/24 to demo decreased fall risk Baseline: 16/24 08/17/23: 14 Goal status: in progress  4.  Pt will be able to perform 5 steps with reciprocal pattern with no handrail to demo increased hip/knee strength and stability Baseline:  Goal status: in progress  ASSESSMENT:  CLINICAL  IMPRESSION: Continued with focus on LE stability.  Added hip hiking with noted challenge for strength and form.  Vectors added in addition to also target gluteal mm.   Pt continues to require both verbal and tactile cues to complete therex in correct form with hold times and correct mm activation. Increased weight for global step outs with resistance on bodycraft noting good challenge with SB-minA at times.  Pt did not require rest, however reported overall muscular fatigue at end of session.  Pt will continue to benefit from skilled therapy to improve deficits and improve function.  PROGRESS NOTE 08/24/23: Patient demonstrated continued improvements in function as indicated by positive significant changes in strength. However, patient still presents with deficits in deficits in balance as indicated in the DGI and Berg balance scores. ABC scale indicates that patient is not confident with his balance. With this, skilled PT is still required to address the impairments and functional limitations listed below.    Eval:Patient is a 77 y.o. M who was seen today for physical therapy evaluation and treatment for gait instability. Assessment significant for very weak proximal LE strength bilat leading to decreased stability and balance for steps, ambulating on even and uneven surfaces limiting community and recreational tasks. Pt's DGI and Berg Balance both indicate a high fall risk. Pt will benefit from PT to address these deficits for safe mobility.   OBJECTIVE IMPAIRMENTS: Abnormal gait, decreased activity tolerance, decreased balance, decreased mobility, difficulty walking, decreased ROM, decreased strength, improper body mechanics, and postural dysfunction.    PLAN:  PT FREQUENCY: 2x/week  PT DURATION: 4 weeks  PLANNED INTERVENTIONS: Therapeutic exercises, Therapeutic activity, Neuromuscular re-education, Balance training, Gait training, Patient/Family education, Self Care, Joint mobilization, Stair  training, Electrical stimulation, Manual therapy, and Re-evaluation  PLAN FOR NEXT SESSION: Continue hip strengthening, core strength and balance.    Lurena Nida, PTA/CLT [facility] Ph: [phone]  8:46 AM, 08/31/23

## 2023-09-08 ENCOUNTER — Ambulatory Visit: Payer: Medicare HMO | Admitting: Podiatry

## 2023-09-08 ENCOUNTER — Encounter: Payer: Self-pay | Admitting: Podiatry

## 2023-09-08 ENCOUNTER — Encounter (HOSPITAL_COMMUNITY): Payer: Self-pay

## 2023-09-08 DIAGNOSIS — B351 Tinea unguium: Secondary | ICD-10-CM | POA: Diagnosis not present

## 2023-09-08 DIAGNOSIS — M79674 Pain in right toe(s): Secondary | ICD-10-CM | POA: Diagnosis not present

## 2023-09-08 DIAGNOSIS — M79675 Pain in left toe(s): Secondary | ICD-10-CM

## 2023-09-08 DIAGNOSIS — Q828 Other specified congenital malformations of skin: Secondary | ICD-10-CM

## 2023-09-08 DIAGNOSIS — E119 Type 2 diabetes mellitus without complications: Secondary | ICD-10-CM | POA: Diagnosis not present

## 2023-09-08 NOTE — Therapy (Signed)
PHYSICAL THERAPY DISCHARGE SUMMARY   Patient spoke to [name] today and states that he's doing better and is wanting to be D/C from PT.  Visits from Start of Care: 13  Current functional level related to goals / functional outcomes: Unable to assess due to unable to return from the last visit   Remaining deficits: Unable to assess due to unable to return from the last visit   Education / Equipment: Unable to assess due to unable to return from the last visit   Patient agrees to discharge. Patient goals were  Unable to assess due to unable to return from the last visit . Patient is being discharged due to the patient's request.   Timothy Brennan, PT, DPT, OCS Board-Certified Clinical Specialist in Orthopedic PT PT Compact Privilege # ([place]): [phone]

## 2023-09-08 NOTE — Progress Notes (Signed)
This patient returns to my office for at risk foot care.  This patient requires this care by a professional since this patient will be at risk due to having diabetes.  This patient is unable to cut nails himself since the patient cannot reach his nails.These nails are painful walking and wearing shoes.He has painful callus on the outside ball of left foot. This patient presents for at risk foot care today.  General Appearance  Alert, conversant and in no acute stress.  Vascular  Dorsalis pedis and posterior tibial  pulses are palpable  bilaterally.  Capillary return is within normal limits  bilaterally. Temperature is within normal limits  bilaterally.  Neurologic  Senn-Weinstein monofilament wire test within normal limits  bilaterally. Muscle power within normal limits bilaterally.  Nails Thick disfigured discolored nails with subungual debris  from hallux to fifth toes bilaterally. No evidence of bacterial infection or drainage bilaterally.  Orthopedic  No limitations of motion  feet .  No crepitus or effusions noted.  No bony pathology or digital deformities noted. Plantar flexed fifth met left foot.  Skin  normotropic skin with no porokeratosis noted bilaterally.  No signs of infections or ulcers noted.   Porokeratosis sub 5th left foot.  Onychomycosis  Pain in right toes  Pain in left toes  Porokeratosis left foot.  Consent was obtained for treatment procedures.   Mechanical debridement of nails 1-5  bilaterally performed with a nail nipper.  Filed with dremel without incident.  Debride callus with dremel tool.   Return office visit    3 months                  Told patient to return for periodic foot care and evaluation due to potential at risk complications.   Helane Gunther DPM

## 2023-12-09 ENCOUNTER — Ambulatory Visit: Payer: Medicare HMO | Admitting: Podiatry

## 2024-01-09 ENCOUNTER — Ambulatory Visit (INDEPENDENT_AMBULATORY_CARE_PROVIDER_SITE_OTHER): Payer: Medicare HMO | Admitting: Podiatry

## 2024-01-09 ENCOUNTER — Encounter: Payer: Self-pay | Admitting: Podiatry

## 2024-01-09 DIAGNOSIS — B351 Tinea unguium: Secondary | ICD-10-CM

## 2024-01-09 DIAGNOSIS — M79675 Pain in left toe(s): Secondary | ICD-10-CM

## 2024-01-09 DIAGNOSIS — E119 Type 2 diabetes mellitus without complications: Secondary | ICD-10-CM

## 2024-01-09 DIAGNOSIS — M79674 Pain in right toe(s): Secondary | ICD-10-CM

## 2024-01-09 DIAGNOSIS — Q828 Other specified congenital malformations of skin: Secondary | ICD-10-CM

## 2024-01-09 NOTE — Progress Notes (Signed)
 This patient returns to my office for at risk foot care.  This patient requires this care by a professional since this patient will be at risk due to having diabetes.  This patient is unable to cut nails himself since the patient cannot reach his nails.These nails are painful walking and wearing shoes.He has painful callus on the outside ball of left foot. This patient presents for at risk foot care today.  General Appearance  Alert, conversant and in no acute stress.  Vascular  Dorsalis pedis and posterior tibial  pulses are palpable  bilaterally.  Capillary return is within normal limits  bilaterally. Temperature is within normal limits  bilaterally.  Neurologic  Senn-Weinstein monofilament wire test within normal limits  bilaterally. Muscle power within normal limits bilaterally.  Nails Thick disfigured discolored nails with subungual debris  from hallux to fifth toes bilaterally. No evidence of bacterial infection or drainage bilaterally.  Orthopedic  No limitations of motion  feet .  No crepitus or effusions noted.  No bony pathology or digital deformities noted. Plantar flexed fifth met left foot.  Skin  normotropic skin with no porokeratosis noted bilaterally.  No signs of infections or ulcers noted.   Porokeratosis sub 5th left foot.  Onychomycosis  Pain in right toes  Pain in left toes  Porokeratosis left foot.  Consent was obtained for treatment procedures.   Mechanical debridement of nails 1-5  bilaterally performed with a nail nipper.  Filed with dremel without incident.  Debride callus with dremel tool.   Return office visit    3 months                  Told patient to return for periodic foot care and evaluation due to potential at risk complications.   Helane Gunther DPM

## 2024-04-11 ENCOUNTER — Ambulatory Visit: Admitting: Podiatry

## 2024-04-11 ENCOUNTER — Encounter: Payer: Self-pay | Admitting: Podiatry

## 2024-04-11 DIAGNOSIS — M79675 Pain in left toe(s): Secondary | ICD-10-CM | POA: Diagnosis not present

## 2024-04-11 DIAGNOSIS — M79674 Pain in right toe(s): Secondary | ICD-10-CM

## 2024-04-11 DIAGNOSIS — E119 Type 2 diabetes mellitus without complications: Secondary | ICD-10-CM

## 2024-04-11 DIAGNOSIS — B351 Tinea unguium: Secondary | ICD-10-CM | POA: Diagnosis not present

## 2024-04-11 DIAGNOSIS — Q828 Other specified congenital malformations of skin: Secondary | ICD-10-CM | POA: Diagnosis not present

## 2024-04-11 NOTE — Progress Notes (Signed)
 This patient returns to my office for at risk foot care.  This patient requires this care by a professional since this patient will be at risk due to having diabetes.  This patient is unable to cut nails himself since the patient cannot reach his nails.These nails are painful walking and wearing shoes.He has painful callus on the outside ball of left foot. This patient presents for at risk foot care today.  General Appearance  Alert, conversant and in no acute stress.  Vascular  Dorsalis pedis and posterior tibial  pulses are palpable  bilaterally.  Capillary return is within normal limits  bilaterally. Temperature is within normal limits  bilaterally.  Neurologic  Senn-Weinstein monofilament wire test within normal limits  bilaterally. Muscle power within normal limits bilaterally.  Nails Thick disfigured discolored nails with subungual debris  from hallux to fifth toes bilaterally. No evidence of bacterial infection or drainage bilaterally.  Orthopedic  No limitations of motion  feet .  No crepitus or effusions noted.  No bony pathology or digital deformities noted. Plantar flexed fifth met left foot.  Skin  normotropic skin with no porokeratosis noted bilaterally.  No signs of infections or ulcers noted.   Porokeratosis sub 5th left foot.  Onychomycosis  Pain in right toes  Pain in left toes  Porokeratosis left foot.  Consent was obtained for treatment procedures.   Mechanical debridement of nails 1-5  bilaterally performed with a nail nipper.  Filed with dremel without incident.  Debride callus with dremel tool.   Return office visit    3 months                  Told patient to return for periodic foot care and evaluation due to potential at risk complications.   Helane Gunther DPM

## 2024-07-12 ENCOUNTER — Ambulatory Visit: Admitting: Podiatry

## 2024-07-12 DIAGNOSIS — M79674 Pain in right toe(s): Secondary | ICD-10-CM

## 2024-07-12 DIAGNOSIS — E119 Type 2 diabetes mellitus without complications: Secondary | ICD-10-CM

## 2024-07-12 DIAGNOSIS — M79675 Pain in left toe(s): Secondary | ICD-10-CM

## 2024-07-12 DIAGNOSIS — B351 Tinea unguium: Secondary | ICD-10-CM

## 2024-07-12 NOTE — Progress Notes (Signed)
 This patient returns to my office for at risk foot care.  This patient requires this care by a professional since this patient will be at risk due to having diabetes.  This patient is unable to cut nails himself since the patient cannot reach his nails.These nails are painful walking and wearing shoes.He has painful callus on the outside ball of left foot. This patient presents for at risk foot care today.  General Appearance  Alert, conversant and in no acute stress.  Vascular  Dorsalis pedis and posterior tibial  pulses are palpable  bilaterally.  Capillary return is within normal limits  bilaterally. Temperature is within normal limits  bilaterally.  Neurologic  Senn-Weinstein monofilament wire test within normal limits  bilaterally. Muscle power within normal limits bilaterally.  Nails Thick disfigured discolored nails with subungual debris  from hallux to fifth toes bilaterally. No evidence of bacterial infection or drainage bilaterally.  Orthopedic  No limitations of motion  feet .  No crepitus or effusions noted.  No bony pathology or digital deformities noted. Plantar flexed fifth met left foot.  Skin  normotropic skin with no porokeratosis noted bilaterally.  No signs of infections or ulcers noted.   Porokeratosis sub 5th left foot.  Onychomycosis  Pain in right toes  Pain in left toes  Porokeratosis left foot.  Consent was obtained for treatment procedures.   Mechanical debridement of nails 1-5  bilaterally performed with a nail nipper.  Filed with dremel without incident.  Debride callus with dremel tool.   Return office visit    3 months                  Told patient to return for periodic foot care and evaluation due to potential at risk complications.   Helane Gunther DPM

## 2024-10-11 ENCOUNTER — Ambulatory Visit: Admitting: Podiatry
